# Patient Record
Sex: Female | Born: 1990 | Race: Black or African American | Hispanic: No | Marital: Single | Smoking: Current every day smoker
Health system: Southern US, Community
[De-identification: ages and names within clinical notes are randomized; demographics above are authoritative.]

## PROBLEM LIST (undated history)

## (undated) DIAGNOSIS — F32A Depression, unspecified: Secondary | ICD-10-CM

## (undated) DIAGNOSIS — F79 Unspecified intellectual disabilities: Secondary | ICD-10-CM

## (undated) DIAGNOSIS — F319 Bipolar disorder, unspecified: Secondary | ICD-10-CM

## (undated) DIAGNOSIS — F909 Attention-deficit hyperactivity disorder, unspecified type: Secondary | ICD-10-CM

## (undated) DIAGNOSIS — F259 Schizoaffective disorder, unspecified: Secondary | ICD-10-CM

## (undated) DIAGNOSIS — E119 Type 2 diabetes mellitus without complications: Secondary | ICD-10-CM

## (undated) HISTORY — DX: Bipolar disorder, unspecified: F31.9

## (undated) HISTORY — DX: Schizoaffective disorder, unspecified: F25.9

## (undated) HISTORY — DX: Attention-deficit hyperactivity disorder, unspecified type: F90.9

## (undated) HISTORY — DX: Type 2 diabetes mellitus without complications: E11.9

---
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
## 2013-07-08 DIAGNOSIS — Z8619 Personal history of other infectious and parasitic diseases: Secondary | ICD-10-CM | POA: Insufficient documentation

## 2013-07-08 DIAGNOSIS — E669 Obesity, unspecified: Secondary | ICD-10-CM | POA: Insufficient documentation

## 2013-07-08 DIAGNOSIS — F17200 Nicotine dependence, unspecified, uncomplicated: Secondary | ICD-10-CM | POA: Insufficient documentation

## 2013-07-08 DIAGNOSIS — N912 Amenorrhea, unspecified: Secondary | ICD-10-CM | POA: Insufficient documentation

## 2013-07-08 DIAGNOSIS — E1165 Type 2 diabetes mellitus with hyperglycemia: Secondary | ICD-10-CM | POA: Insufficient documentation

## 2013-07-08 DIAGNOSIS — E119 Type 2 diabetes mellitus without complications: Secondary | ICD-10-CM | POA: Insufficient documentation

## 2013-07-18 ENCOUNTER — Ambulatory Visit: Payer: Self-pay | Admitting: Emergency Medicine

## 2013-09-19 DIAGNOSIS — J4520 Mild intermittent asthma, uncomplicated: Secondary | ICD-10-CM | POA: Insufficient documentation

## 2016-02-15 ENCOUNTER — Encounter: Payer: Self-pay | Admitting: Psychiatry

## 2016-02-15 ENCOUNTER — Ambulatory Visit: Payer: Self-pay | Admitting: Psychiatry

## 2016-02-15 DIAGNOSIS — F319 Bipolar disorder, unspecified: Secondary | ICD-10-CM | POA: Insufficient documentation

## 2016-02-15 DIAGNOSIS — F209 Schizophrenia, unspecified: Secondary | ICD-10-CM | POA: Insufficient documentation

## 2016-02-15 NOTE — Progress Notes (Deleted)
Psychiatric Initial Adult Assessment   Patient Identification: Alexandria Reynolds MRN:  [id number] Date of Evaluation:  02/15/2016 Referral Source:  Chief Complaint:   Chief Complaint    Establish Care     Visit Diagnosis: No diagnosis found. Diagnosis:  There are no active problems to display for this patient.  History of Present Illness:        Associated Signs/Symptoms: Depression Symptoms:  {DEPRESSION SYMPTOMS:20000} (Hypo) Manic Symptoms:  {BHH MANIC SYMPTOMS:22872} Anxiety Symptoms:  {BHH ANXIETY SYMPTOMS:22873} Psychotic Symptoms:  {BHH PSYCHOTIC SYMPTOMS:22874} PTSD Symptoms: {BHH PTSD SYMPTOMS:22875}  Past Medical History: No past medical history on file. No past surgical history on file. Family History: No family history on file. Social History:   Social History   Social History  . Marital Status: Single    Spouse Name: N/A  . Number of Children: N/A  . Years of Education: N/A   Social History Main Topics  . Smoking status: Current Every Day Smoker -- 0.05 packs/day    Types: Cigarettes    Start date: 02/14/2014  . Smokeless tobacco: Never Used  . Alcohol Use: None  . Drug Use: None  . Sexual Activity: Not Asked   Other Topics Concern  . None   Social History Narrative  . None   Additional Social History: ***  Musculoskeletal: Strength & Muscle Tone: {desc; muscle tone:32375} Gait & Station: {PE GAIT ED NATL:22525} Patient leans: {Patient Leans:21022755}  Psychiatric Specialty Exam: HPI  ROS  Blood pressure 142/80, pulse 97, temperature 98.5 F (36.9 C), temperature source Tympanic, height 5\' 5"  (1.651 m), weight 246 lb 9.6 oz (111.857 kg), last menstrual period 02/15/2015, SpO2 97 %.Body mass index is 41.04 kg/(m^2).  General Appearance: {Appearance:22683}  Eye Contact:  {BHH EYE CONTACT:22684}  Speech:  {Speech:22685}  Volume:  {Volume (PAA):22686}  Mood:  {BHH MOOD:22306}  Affect:  {Affect (PAA):22687}  Thought Process:  {Thought  Process (PAA):22688}  Orientation:  {BHH ORIENTATION (PAA):22689}  Thought Content:  {Thought Content:22690}  Suicidal Thoughts:  {ST/HT (PAA):22692}  Homicidal Thoughts:  {ST/HT (PAA):22692}  Memory:  {BHH MEMORY:22881}  Judgement:  {Judgement (PAA):22694}  Insight:  {Insight (PAA):22695}  Psychomotor Activity:  {Psychomotor (PAA):22696}  Concentration:  {BHH GOOD/FAIR/POOR:22877}  Recall:  {BHH GOOD/FAIR/POOR:22877}  Fund of Knowledge:{BHH GOOD/FAIR/POOR:22877}  Language: {BHH GOOD/FAIR/POOR:22877}  Akathisia:  {BHH YES OR NO:22294}  Handed:  {Handed:22697}  AIMS (if indicated):  ***  Assets:  {Assets (PAA):22698}  ADL's:  {BHH WNU'U:72536}ADL'S:22290}  Cognition: {chl bhh cognition:304700322}  Sleep:  ***   Is the patient at risk to self?  {yes no:314532} Has the patient been a risk to self in the past 6 months?  {yes no:314532} Has the patient been a risk to self within the distant past?  {yes no:314532} Is the patient a risk to others?  {yes no:314532} Has the patient been a risk to others in the past 6 months?  {yes no:314532} Has the patient been a risk to others within the distant past?  {yes no:314532}  Allergies:  Allergies not on file Current Medications: No current outpatient prescriptions on file.   No current facility-administered medications for this visit.    Previous Psychotropic Medications: {YES/NO:21197}  Substance Abuse History in the last 12 months:  {yes no:314532}  Consequences of Substance Abuse: {BHH CONSEQUENCES OF SUBSTANCE ABUSE:22880}  Medical Decision Making:  {bh medical decision making:21022756}  Treatment Plan Summary: {CHL AMB BH MD TX Plan:[phone]}    Alexandria Reynolds 3/21/20171:20 PM

## 2016-02-29 ENCOUNTER — Ambulatory Visit: Payer: Self-pay | Admitting: Licensed Clinical Social Worker

## 2016-03-28 ENCOUNTER — Ambulatory Visit (INDEPENDENT_AMBULATORY_CARE_PROVIDER_SITE_OTHER): Payer: MEDICAID | Admitting: Licensed Clinical Social Worker

## 2016-03-28 ENCOUNTER — Encounter: Payer: Self-pay | Admitting: Licensed Clinical Social Worker

## 2016-03-28 DIAGNOSIS — F209 Schizophrenia, unspecified: Secondary | ICD-10-CM

## 2016-03-28 DIAGNOSIS — F319 Bipolar disorder, unspecified: Secondary | ICD-10-CM
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
## 2016-03-29 ENCOUNTER — Telehealth: Payer: Self-pay | Admitting: Licensed Clinical Social Worker

## 2016-03-29 NOTE — Telephone Encounter (Signed)
Therapist left message for Alexandria Reynolds to contact her.

## 2016-03-29 NOTE — Telephone Encounter (Signed)
Therapist discussed with guardian, Alexandria Reynolds, recommendation that patient would benefit from a comprehensive team approach to better serve her needs rather than individual therapy. Also, related that guardian would need to come in to agency to sign releases to further discuss.

## 2016-03-29 NOTE — Telephone Encounter (Signed)
Therapist left message for guardian that patient contacted this agency for services and was assessed for services.

## 2016-06-16 ENCOUNTER — Emergency Department
Admission: EM | Admit: 2016-06-16 | Discharge: 2016-06-16 | Disposition: A | Discharge status: Home / Self Care | Payer: Medicare Other | Attending: Emergency Medicine | Admitting: Emergency Medicine

## 2016-06-16 DIAGNOSIS — Z79899 Other long term (current) drug therapy: Secondary | ICD-10-CM | POA: Diagnosis not present

## 2016-06-16 DIAGNOSIS — Y999 Unspecified external cause status: Secondary | ICD-10-CM | POA: Diagnosis not present

## 2016-06-16 DIAGNOSIS — Y9301 Activity, walking, marching and hiking: Secondary | ICD-10-CM | POA: Diagnosis not present

## 2016-06-16 DIAGNOSIS — Z794 Long term (current) use of insulin: Secondary | ICD-10-CM | POA: Insufficient documentation

## 2016-06-16 DIAGNOSIS — Y929 Unspecified place or not applicable: Secondary | ICD-10-CM | POA: Diagnosis not present

## 2016-06-16 DIAGNOSIS — W010XXA Fall on same level from slipping, tripping and stumbling without subsequent striking against object, initial encounter: Secondary | ICD-10-CM | POA: Diagnosis not present

## 2016-06-16 DIAGNOSIS — J4520 Mild intermittent asthma, uncomplicated: Secondary | ICD-10-CM | POA: Insufficient documentation

## 2016-06-16 DIAGNOSIS — M25561 Pain in right knee: Secondary | ICD-10-CM | POA: Diagnosis present

## 2016-06-16 DIAGNOSIS — S8391XA Sprain of unspecified site of right knee, initial encounter: Secondary | ICD-10-CM | POA: Diagnosis not present

## 2016-06-16 DIAGNOSIS — E119 Type 2 diabetes mellitus without complications: Secondary | ICD-10-CM | POA: Diagnosis not present

## 2016-06-16 DIAGNOSIS — F17210 Nicotine dependence, cigarettes, uncomplicated: Secondary | ICD-10-CM | POA: Insufficient documentation

## 2016-06-16 DIAGNOSIS — Z7984 Long term (current) use of oral hypoglycemic drugs: Secondary | ICD-10-CM | POA: Diagnosis not present

## 2016-06-16 MED ORDER — MELOXICAM 15 MG PO TABS: 15.0000 mg | ORAL_TABLET | Freq: Every day | ORAL | Status: DC
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
## 2016-06-16 NOTE — ED Notes (Signed)
Patient reports slipped on a wet floor and fell.  Patient complains of pain to right knee. Patient ambulatory without difficulty or obvious distress noted.

## 2016-06-16 NOTE — Discharge Instructions (Signed)
How to Use a Knee Brace A knee brace is a device that you wear to support your knee, especially if the knee is healing after an injury or surgery. There are several types of knee braces. Some are designed to prevent an injury (prophylactic brace). These are often worn during sports. Others support an injured knee (functional brace) or keep it still while it heals (rehabilitative brace). People with severe arthritis of the knee may benefit from a brace that takes some pressure off the knee (unloader brace). Most knee braces are made from a combination of cloth and metal or plastic.  You may need to wear a knee brace to:  Relieve knee pain.  Help your knee support your weight (improve stability).  Help you walk farther (improve mobility).  Prevent injury.  Support your knee while it heals from surgery or from an injury. RISKS AND COMPLICATIONS Generally, knee braces are very safe to wear. However, problems may occur, including:  Skin irritation that may lead to infection.  Making your condition worse if you wear the brace in the wrong way. HOW TO USE A KNEE BRACE Different braces will have different instructions for use. Your health care provider will tell you or show you:  How to put on your brace.  How to adjust the brace.  When and how often to wear the brace.  How to remove the brace.  If you will need any assistive devices in addition to the brace, such as crutches or a cane. In general, your brace should:  Have the hinge of the brace line up with the bend of your knee.  Have straps, hooks, or tapes that fasten snugly around your leg.  Not feel too tight or too loose. HOW TO CARE FOR A KNEE BRACE  Check your brace often for signs of damage, such as loose connections or attachments. Your knee brace may get damaged or wear out during normal use.  Wash the fabric parts of your brace with soap and water.  Read the insert that comes with your brace for other specific care  instructions. SEEK MEDICAL CARE IF:  Your knee brace is too loose or too tight and you cannot adjust it.  Your knee brace causes skin redness, swelling, bruising, or irritation.  Your knee brace is not helping.  Your knee brace is making your knee pain worse.   This information is not intended to replace advice given to you by your health care provider. Make sure you discuss any questions you have with your health care provider.   Document Released: 02/03/2004 Document Revised: 08/04/2015 Document Reviewed: 03/08/2015 Elsevier Interactive Patient Education 2016 Elsevier Inc.  Knee Sprain A knee sprain is a tear in one of the strong, fibrous tissues that connect the bones (ligaments) in your knee. The severity of the sprain depends on how much of the ligament is torn. The tear can be either partial or complete. CAUSES  Often, sprains are a result of a fall or injury. The force of the impact causes the fibers of your ligament to stretch too much. This excess tension causes the fibers of your ligament to tear. SIGNS AND SYMPTOMS  You may have some loss of motion in your knee. Other symptoms include:  Bruising.  Pain in the knee area.  Tenderness of the knee to the touch.  Swelling. DIAGNOSIS  To diagnose a knee sprain, your health care provider will physically examine your knee. Your health care provider may also suggest an X-ray exam  of your knee to make sure no bones are broken. TREATMENT  If your ligament is only partially torn, treatment usually involves keeping the knee in a fixed position (immobilization) or bracing your knee for activities that require movement for several weeks. To do this, your health care provider will apply a bandage, cast, or splint to keep your knee from moving and to support your knee during movement until it heals. For a partially torn ligament, the healing process usually takes 4-6 weeks. If your ligament is completely torn, depending on which  ligament it is, you may need surgery to reconnect the ligament to the bone or reconstruct it. After surgery, a cast or splint may be applied and will need to stay on your knee for 4-6 weeks while your ligament heals. HOME CARE INSTRUCTIONS  Keep your injured knee elevated to decrease swelling.  To ease pain and swelling, apply ice to the injured area:  Put ice in a plastic bag.  Place a towel between your skin and the bag.  Leave the ice on for 20 minutes, 2-3 times a day.  Only take medicine for pain as directed by your health care provider.  Do not leave your knee unprotected until pain and stiffness go away (usually 4-6 weeks).  If you have a cast or splint, do not allow it to get wet. If you have been instructed not to remove it, cover it with a plastic bag when you shower or bathe. Do not swim.  Your health care provider may suggest exercises for you to do during your recovery to prevent or limit permanent weakness and stiffness. SEEK IMMEDIATE MEDICAL CARE IF:  Your cast or splint becomes damaged.  Your pain becomes worse.  You have significant pain, swelling, or numbness below the cast or splint. MAKE SURE YOU:  Understand these instructions.  Will watch your condition.  Will get help right away if you are not doing well or get worse.   This information is not intended to replace advice given to you by your health care provider. Make sure you discuss any questions you have with your health care provider.   Document Released: 11/13/2005 Document Revised: 12/04/2014 Document Reviewed: 06/25/2013 Elsevier Interactive Patient Education [employer]2016 Elsevier Inc.

## 2016-06-16 NOTE — ED Notes (Signed)
Pt discharged to home.  Family member driving.  Discharge instructions reviewed.  Verbalized understanding.  No questions or concerns at this time.  Teach back verified.  Pt in NAD.  No items left in ED.   

## 2016-08-01 ENCOUNTER — Encounter: Payer: Self-pay | Admitting: Emergency Medicine

## 2016-08-01 ENCOUNTER — Emergency Department
Admission: EM | Admit: 2016-08-01 | Discharge: 2016-08-01 | Disposition: A | Discharge status: Home / Self Care | Payer: 59 | Attending: Emergency Medicine | Admitting: Emergency Medicine

## 2016-08-01 DIAGNOSIS — Z79899 Other long term (current) drug therapy: Secondary | ICD-10-CM | POA: Insufficient documentation

## 2016-08-01 DIAGNOSIS — F909 Attention-deficit hyperactivity disorder, unspecified type: Secondary | ICD-10-CM | POA: Diagnosis not present

## 2016-08-01 DIAGNOSIS — E119 Type 2 diabetes mellitus without complications: Secondary | ICD-10-CM | POA: Insufficient documentation

## 2016-08-01 DIAGNOSIS — J4520 Mild intermittent asthma, uncomplicated: Secondary | ICD-10-CM | POA: Insufficient documentation

## 2016-08-01 DIAGNOSIS — F319 Bipolar disorder, unspecified: Secondary | ICD-10-CM | POA: Diagnosis present

## 2016-08-01 DIAGNOSIS — F17210 Nicotine dependence, cigarettes, uncomplicated: Secondary | ICD-10-CM | POA: Diagnosis not present

## 2016-08-01 DIAGNOSIS — F203 Undifferentiated schizophrenia: Secondary | ICD-10-CM

## 2016-08-01 DIAGNOSIS — Z5181 Encounter for therapeutic drug level monitoring: Secondary | ICD-10-CM | POA: Diagnosis not present

## 2016-08-01 DIAGNOSIS — F209 Schizophrenia, unspecified: Secondary | ICD-10-CM | POA: Diagnosis not present

## 2016-08-01 DIAGNOSIS — Z794 Long term (current) use of insulin: Secondary | ICD-10-CM | POA: Diagnosis not present

## 2016-08-01 DIAGNOSIS — R4689 Other symptoms and signs involving appearance and behavior: Secondary | ICD-10-CM

## 2016-08-01 DIAGNOSIS — E1165 Type 2 diabetes mellitus with hyperglycemia: Secondary | ICD-10-CM

## 2016-08-01 DIAGNOSIS — F89 Unspecified disorder of psychological development: Secondary | ICD-10-CM

## 2016-08-01 DIAGNOSIS — Z7984 Long term (current) use of oral hypoglycemic drugs: Secondary | ICD-10-CM | POA: Diagnosis not present

## 2016-08-01 DIAGNOSIS — F918 Other conduct disorders: Secondary | ICD-10-CM | POA: Diagnosis present

## 2016-08-01 LAB — CBC
HCT: 41.8 % (ref 35.0–47.0)
Hemoglobin: 14.1 g/dL (ref 12.0–16.0)
MCH: 28.2 pg (ref 26.0–34.0)
MCHC: 33.8 g/dL (ref 32.0–36.0)
MCV: 83.5 fL (ref 80.0–100.0)
Platelets: 244 10*3/uL (ref 150–440)
RBC: 5.01 MIL/uL (ref 3.80–5.20)
RDW: 13.3 % (ref 11.5–14.5)
WBC: 6.4 10*3/uL (ref 3.6–11.0)

## 2016-08-01 LAB — URINE DRUG SCREEN, QUALITATIVE (ARMC ONLY)
Amphetamines, Ur Screen: NOT DETECTED
Barbiturates, Ur Screen: NOT DETECTED
Benzodiazepine, Ur Scrn: NOT DETECTED
Cannabinoid 50 Ng, Ur ~~LOC~~: NOT DETECTED
Cocaine Metabolite,Ur ~~LOC~~: NOT DETECTED
MDMA (Ecstasy)Ur Screen: NOT DETECTED
Methadone Scn, Ur: NOT DETECTED
Opiate, Ur Screen: NOT DETECTED
Phencyclidine (PCP) Ur S: NOT DETECTED
Tricyclic, Ur Screen: POSITIVE — AB

## 2016-08-01 LAB — COMPREHENSIVE METABOLIC PANEL
ALT: 41 U/L (ref 14–54)
AST: 44 U/L — ABNORMAL HIGH (ref 15–41)
Albumin: 4.6 g/dL (ref 3.5–5.0)
Alkaline Phosphatase: 58 U/L (ref 38–126)
Anion gap: 7 (ref 5–15)
BUN: 11 mg/dL (ref 6–20)
CO2: 24 mmol/L (ref 22–32)
Calcium: 9.5 mg/dL (ref 8.9–10.3)
Chloride: 106 mmol/L (ref 101–111)
Creatinine, Ser: 0.68 mg/dL (ref 0.44–1.00)
GFR calc Af Amer: >60 mL/min (ref >60)
GFR calc non Af Amer: >60 mL/min (ref >60)
Glucose, Bld: 90 mg/dL (ref 65–99)
Potassium: 3.7 mmol/L (ref 3.5–5.1)
Sodium: 137 mmol/L (ref 135–145)
Total Bilirubin: 0.4 mg/dL (ref 0.3–1.2)
Total Protein: 8.7 g/dL — ABNORMAL HIGH (ref 6.5–8.1)

## 2016-08-01 LAB — SALICYLATE LEVEL: Salicylate Lvl: <4 mg/dL (ref 2.8–30.0)

## 2016-08-01 LAB — ETHANOL: Alcohol, Ethyl (B): <5 mg/dL (ref <5)

## 2016-08-01 LAB — POCT PREGNANCY, URINE: Preg Test, Ur: NEGATIVE

## 2016-08-01 LAB — GLUCOSE, CAPILLARY: Glucose-Capillary: 109 mg/dL — ABNORMAL HIGH (ref 65–99)

## 2016-08-01 LAB — ACETAMINOPHEN LEVEL: Acetaminophen (Tylenol), Serum: <10 ug/mL — ABNORMAL LOW (ref 10–30)

## 2016-08-01 MED ORDER — INSULIN ASPART 100 UNIT/ML FLEXPEN: 12.0000 [IU] | PEN_INJECTOR | Freq: Three times a day (TID) | SUBCUTANEOUS | Status: DC

## 2016-08-01 MED ORDER — DARIFENACIN HYDROBROMIDE ER 15 MG PO TB24
15.0000 mg | ORAL_TABLET | Freq: Every day | ORAL | Status: DC
  Filled 2016-08-01 (×2): qty 1

## 2016-08-01 MED ORDER — LORATADINE 10 MG PO TABS: 10.0000 mg | ORAL_TABLET | Freq: Every day | ORAL | Status: DC

## 2016-08-01 MED ORDER — INSULIN DETEMIR 100 UNIT/ML ~~LOC~~ SOLN: 23.0000 [IU] | Freq: Every day | SUBCUTANEOUS | Status: DC

## 2016-08-01 MED ORDER — METOCLOPRAMIDE HCL 10 MG PO TABS: 10.0000 mg | ORAL_TABLET | Freq: Two times a day (BID) | ORAL | Status: DC

## 2016-08-01 MED ORDER — MELOXICAM 7.5 MG PO TABS: 15.0000 mg | ORAL_TABLET | Freq: Every day | ORAL | Status: DC

## 2016-08-01 MED ORDER — TRAZODONE HCL 100 MG PO TABS: 100.0000 mg | ORAL_TABLET | Freq: Every evening | ORAL | Status: DC | PRN

## 2016-08-01 MED ORDER — BACITRACIN ZINC 500 UNIT/GM EX OINT
TOPICAL_OINTMENT | CUTANEOUS | Status: AC
  Filled 2016-08-01: qty 0.9

## 2016-08-01 MED ORDER — LAMOTRIGINE 25 MG PO TABS: 200.0000 mg | ORAL_TABLET | Freq: Every day | ORAL | Status: DC

## 2016-08-01 MED ORDER — CHLORPROMAZINE HCL 100 MG PO TABS
100.0000 mg | ORAL_TABLET | Freq: Every day | ORAL | Status: DC
  Filled 2016-08-01: qty 1

## 2016-08-01 MED ORDER — METFORMIN HCL 500 MG PO TABS: 500.0000 mg | ORAL_TABLET | Freq: Two times a day (BID) | ORAL | Status: DC
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
## 2016-08-01 NOTE — ED Provider Notes (Signed)
Discussed with Dr. Toni Amendlapacs after his evaluation. Feels the patient is psychiatrically stable and not requiring any acute treatment or inpatient management. The shank and be discharged back to her group home. IVC rescinded. Patient is medically stable, will discharge to outpatient follow-up.   Sharman CheekPhillip Lamanda Rudder, MD 08/01/16 [phone]

## 2016-08-01 NOTE — ED Notes (Signed)
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
## 2016-08-01 NOTE — ED Notes (Signed)
Patient presents to the ED under IVC from group home.  Per Officer patient has been calm and appropriate since noon.  Patient states, "I feel like I've been more angry lately, I think I need more of my medicines."  Patient reports feeling unsafe generally and states, "I don't really want to talk about it."  Patient denies SI and HI.  Denies auditory and visual hallucinations.  Denies drug use.

## 2016-08-01 NOTE — ED Notes (Signed)
Pt is awaiting for transportation back to her group home.

## 2016-08-01 NOTE — ED Notes (Signed)
BEHAVIORAL HEALTH ROUNDING Patient sleeping: No. Patient alert and oriented: yes Behavior appropriate: Yes.  ; If no, describe:  Nutrition and fluids offered: Yes  Toileting and hygiene offered: Yes  Sitter present: q 15 min checks Law enforcement present: Yes  
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
## 2017-08-04 ENCOUNTER — Emergency Department
Admission: EM | Admit: 2017-08-04 | Discharge: 2017-08-04 | Disposition: A | Discharge status: Home / Self Care | Payer: Medicare Other | Attending: Emergency Medicine | Admitting: Emergency Medicine

## 2017-08-04 DIAGNOSIS — F319 Bipolar disorder, unspecified: Secondary | ICD-10-CM | POA: Insufficient documentation

## 2017-08-04 DIAGNOSIS — Z794 Long term (current) use of insulin: Secondary | ICD-10-CM | POA: Diagnosis not present

## 2017-08-04 DIAGNOSIS — F17210 Nicotine dependence, cigarettes, uncomplicated: Secondary | ICD-10-CM | POA: Insufficient documentation

## 2017-08-04 DIAGNOSIS — J45909 Unspecified asthma, uncomplicated: Secondary | ICD-10-CM | POA: Insufficient documentation

## 2017-08-04 DIAGNOSIS — E119 Type 2 diabetes mellitus without complications: Secondary | ICD-10-CM | POA: Insufficient documentation

## 2017-08-04 DIAGNOSIS — F4320 Adjustment disorder, unspecified: Secondary | ICD-10-CM | POA: Diagnosis not present

## 2017-08-04 DIAGNOSIS — F329 Major depressive disorder, single episode, unspecified: Secondary | ICD-10-CM | POA: Diagnosis present

## 2017-08-04 DIAGNOSIS — Z79899 Other long term (current) drug therapy: Secondary | ICD-10-CM | POA: Insufficient documentation

## 2017-08-04 LAB — COMPREHENSIVE METABOLIC PANEL
ALT: 38 U/L (ref 14–54)
AST: 44 U/L — ABNORMAL HIGH (ref 15–41)
Albumin: 4.1 g/dL (ref 3.5–5.0)
Alkaline Phosphatase: 52 U/L (ref 38–126)
Anion gap: 6 (ref 5–15)
BUN: 9 mg/dL (ref 6–20)
CO2: 25 mmol/L (ref 22–32)
Calcium: 9.3 mg/dL (ref 8.9–10.3)
Chloride: 107 mmol/L (ref 101–111)
Creatinine, Ser: 0.68 mg/dL (ref 0.44–1.00)
GFR calc Af Amer: >60 mL/min (ref >60)
GFR calc non Af Amer: >60 mL/min (ref >60)
Glucose, Bld: 145 mg/dL — ABNORMAL HIGH (ref 65–99)
Potassium: 3.4 mmol/L — ABNORMAL LOW (ref 3.5–5.1)
Sodium: 138 mmol/L (ref 135–145)
Total Bilirubin: 0.3 mg/dL (ref 0.3–1.2)
Total Protein: 8.4 g/dL — ABNORMAL HIGH (ref 6.5–8.1)

## 2017-08-04 LAB — URINE DRUG SCREEN, QUALITATIVE (ARMC ONLY)
Amphetamines, Ur Screen: NOT DETECTED
Barbiturates, Ur Screen: NOT DETECTED
Benzodiazepine, Ur Scrn: NOT DETECTED
Cannabinoid 50 Ng, Ur ~~LOC~~: NOT DETECTED
Cocaine Metabolite,Ur ~~LOC~~: NOT DETECTED
MDMA (Ecstasy)Ur Screen: NOT DETECTED
Methadone Scn, Ur: NOT DETECTED
Opiate, Ur Screen: NOT DETECTED
Phencyclidine (PCP) Ur S: NOT DETECTED
Tricyclic, Ur Screen: POSITIVE — AB

## 2017-08-04 LAB — CBC
HCT: 40.1 % (ref 35.0–47.0)
Hemoglobin: 13.5 g/dL (ref 12.0–16.0)
MCH: 28.4 pg (ref 26.0–34.0)
MCHC: 33.7 g/dL (ref 32.0–36.0)
MCV: 84.1 fL (ref 80.0–100.0)
Platelets: 245 10*3/uL (ref 150–440)
RBC: 4.77 MIL/uL (ref 3.80–5.20)
RDW: 13.3 % (ref 11.5–14.5)
WBC: 5.4 10*3/uL (ref 3.6–11.0)

## 2017-08-04 LAB — POCT PREGNANCY, URINE: Preg Test, Ur: NEGATIVE

## 2017-08-04 LAB — ETHANOL: Alcohol, Ethyl (B): <5 mg/dL (ref <5)

## 2017-08-04 NOTE — ED Notes (Signed)
BEHAVIORAL HEALTH ROUNDING Patient sleeping: No. Patient alert and oriented: yes Behavior appropriate: Yes.  ; If no, describe:  Nutrition and fluids offered: Yes  Toileting and hygiene offered: Yes  Sitter present: not applicable Law enforcement present: Yes  

## 2017-08-04 NOTE — ED Notes (Signed)
Pt has one backpack. No cell phone. 3 dollars in front part of backpack. Clothes placed in bag with backpack.

## 2017-08-04 NOTE — ED Notes (Signed)
BEHAVIORAL HEALTH ROUNDING Patient sleeping: Yes.   Patient alert and oriented: not applicable Behavior appropriate: Yes.  ; If no, describe:  Nutrition and fluids offered: Yes  Toileting and hygiene offered: Yes  Sitter present: not applicable Law enforcement present: Yes  

## 2017-08-04 NOTE — ED Provider Notes (Signed)
[facility] Emergency Department Provider Note  ____________________________________________   First MD Initiated Contact with Patient 08/04/17 1425     (approximate)  I have reviewed the triage vital signs and the nursing notes.   HISTORY  Chief Complaint Psychiatric Evaluation    HPI Alexandria Reynolds is a 26 y.o. female with psychiatric history as listed below who lives in a group home who presents from the group home for evaluation.  She states that he became very upset today because the other people in a group home were talking about their families and she has no family.  As result she became depressed but also became angry and could not control her temper.  She believes this is because she needs an adjustment to her medications.  She states she has been taking her medications as prescribed but could not control her temper.  She describes onset of her symptoms as acute and they were severe but she is calm and appropriate at this time.  She denies any recent illnesses.  The patient denies fever/chills, chest pain, shortness of breath, nausea, vomiting, abdominal pain, dysuria.she denies suicidal ideation and homicidal ideation.   Past Medical History:  Diagnosis Date  . ADHD (attention deficit hyperactivity disorder)   . Bipolar disorder (HCC)   . Diabetes mellitus, type II (HCC)   . Schizoaffective disorder [facility])     Patient Active Problem List   Diagnosis Date Noted  . Developmental disability 08/01/2016  . Bipolar I disorder (HCC) 03/28/2016  . Bipolar affective disorder (H[facility]) 02/15/2016  . Schizophrenia (H[facility]) 02/15/2016  . Asthma, mild intermittent 09/19/2013  . Absence of menstruation 07/08/2013  . Type 2 diabetes mellitus (H[facility]) 07/08/2013  . H/O infectious disease 07/08/2013  . Adiposity 07/08/2013  . Current smoker 07/08/2013    History reviewed. No pertinent surgical history.  Prior to Admission medications   Medication Sig Start  Date End Date Taking? Authorizing Provider  chlorproMAZINE (THORAZINE) 50 MG tablet Take 100 mg by mouth at bedtime. Given at 8am and 3pm    [provider]  insulin aspart (NOVOLOG) 100 UNIT/ML FlexPen Inject 12 Units into the skin 3 (three) times daily with meals.  07/02/13   [provider]  insulin detemir (LEVEMIR) 100 UNIT/ML injection Inject 23 Units into the skin at bedtime. Flex pen    [provider]  lamoTRIgine (LAMICTAL) 150 MG tablet Take 200 mg by mouth daily.     [provider]  loratadine (CLARITIN) 10 MG tablet  06/29/14   [provider]  meloxicam (MOBIC) 15 MG tablet Take 1 tablet (15 mg total) by mouth daily. 06/16/16   Cuthriell, Delorise Royals, PA-C  metFORMIN (GLUCOPHAGE) 500 MG tablet Take 500 mg by mouth 2 (two) times daily.    [provider]  metoCLOPramide (REGLAN) 10 MG tablet Take by mouth 2 (two) times daily.     [provider]  solifenacin (VESICARE) 10 MG tablet Take 10 mg by mouth daily.    [provider]  traZODone (DESYREL) 100 MG tablet Take by mouth.    [provider]    Allergies Amoxicillin and Penicillins  Family History  Problem Relation Age of Onset  . Drug abuse Mother   . Alcohol abuse Mother   . Alcohol abuse Brother   . Drug abuse Brother   . Alcohol abuse Brother     Social History Social History  Substance Use Topics  . Smoking status: Current Every Day Smoker  Packs/day: 0.05    Types: Cigarettes    Start date: 02/14/2014  . Smokeless tobacco: Never Used  . Alcohol use No    Review of Systems Constitutional: No fever/chills Eyes: No visual changes. ENT: No sore throat. Cardiovascular: Denies chest pain. Respiratory: Denies shortness of breath. Gastrointestinal: No abdominal pain.  No nausea, no vomiting.  No diarrhea.  No constipation. Genitourinary: Negative for dysuria. Musculoskeletal: Negative for neck pain.  Negative for back  pain. Integumentary: Negative for rash. Neurological: Negative for headaches, focal weakness or numbness. Psychiatric:some depression and anger management issues, but no suicidal ideation  ____________________________________________   PHYSICAL EXAM:  VITAL SIGNS: ED Triage Vitals  Enc Vitals Group     BP 08/04/17 1217 134/87     Pulse Rate 08/04/17 1217 (!) 106     Resp 08/04/17 1217 18     Temp 08/04/17 1217 98.6 F (37 C)     Temp Source 08/04/17 1217 Oral     SpO2 08/04/17 1217 96 %     Weight 08/04/17 1218 104.3 kg (230 lb)     Height 08/04/17 1218 1.702 m (5\' 7" )     Head Circumference --      Peak Flow --      Pain Score --      Pain Loc --      Pain Edu? --      Excl. in GC? --     Constitutional: Alert and oriented. Well appearing and in no acute distress. Eyes: Conjunctivae are normal.  Head: Atraumatic. Cardiovascular: Normal rate, regular rhythm. Good peripheral circulation.  Respiratory: Normal respiratory effort.  No retractions.  Musculoskeletal: No lower extremity tenderness nor edema. No gross deformities of extremities. Neurologic:  Normal speech and language. No gross focal neurologic deficits are appreciated.  Skin:  Skin is warm, dry and intact. No rash noted. Psychiatric: Mood and affect are normal. Speech and behavior are normal. calm and cooperative.  Denies SI/HI  ____________________________________________   LABS (all labs ordered are listed, but only abnormal results are displayed)  Labs Reviewed  COMPREHENSIVE METABOLIC PANEL - Abnormal; Notable for the following:       Result Value   Potassium 3.4 (*)    Glucose, Bld 145 (*)    Total Protein 8.4 (*)    AST 44 (*)    All other components within normal limits  URINE DRUG SCREEN, QUALITATIVE ([facility] ONLY) - Abnormal; Notable for the following:    Tricyclic, Ur Screen POSITIVE (*)    All other components within normal limits  ETHANOL  CBC  POCT PREGNANCY, URINE    ____________________________________________  EKG  None - EKG not ordered by ED physician ____________________________________________  RADIOLOGY   No results found.  ____________________________________________   PROCEDURES  Critical Care performed: No   Procedure(s) performed:   Procedures   ____________________________________________   INITIAL IMPRESSION / ASSESSMENT AND PLAN / ED COURSE  Pertinent labs & imaging results that were available during my care of the patient were reviewed by me and considered in my medical decision making (see chart for details).  patient is well-appearing and in no acute distress.  She seems to have insight into her emotional state and has no intention of hurting herself or anyone else.  She does not meet criteria for involuntary commitment.  She is requesting to speak with psychiatrist.  I explained that a psychiatrist very likely will not adjust her medications today, but she would still like to speak with one about her  problems.  I have ordered a telepsych consult and will defer to their recommendations and opinion.  No evidence of acute or emergent medical condition at this time.      ____________________________________________  FINAL CLINICAL IMPRESSION(S) / ED DIAGNOSES  Final diagnoses:  Bipolar affective disorder, remission status unspecified (HCC)     MEDICATIONS GIVEN DURING THIS VISIT:  Medications - No data to display   NEW OUTPATIENT MEDICATIONS STARTED DURING THIS VISIT:  New Prescriptions   No medications on file    Modified Medications   No medications on file    Discontinued Medications   No medications on file     Note:  This document was prepared using Dragon voice recognition software and may include unintentional dictation errors.    Loleta Rose, MD 08/04/17 [phone]

## 2017-08-04 NOTE — ED Notes (Signed)
Joycelyn ManRobin [facility] from group home contacted at [phone] and notified patient discharged. States she will be able to contact patients primary psychiatry on Monday to make follow up appointment. Patient dressing for discharge and group home will pick her up from lobby.

## 2017-08-04 NOTE — ED Notes (Signed)

## 2017-08-04 NOTE — Discharge Instructions (Signed)
Please seek medical attention and help for any thoughts about wanting to harm yourself, harm others, any concerning change in behavior, severe depression, inappropriate drug use or any other new or concerning symptoms. ° °

## 2017-08-04 NOTE — ED Provider Notes (Signed)
-----------------------------------------   5:07 PM on 08/04/2017 -----------------------------------------  Patient seen by specialist on call. I do not feel she requires an inpatient admission at this time. They did not recommend any medication changes. Will discharge.   Phineas SemenGoodman, Willim Turnage, MD 08/04/17 [phone]

## 2017-08-04 NOTE — ED Notes (Signed)
Has taken lunch, resting on stretcher with eyes closed.

## 2017-08-04 NOTE — ED Triage Notes (Signed)
Pt came to ED from group home [facility] for psych eval. History of bipolar, schizophrenia, adhd. Reports wants to talk to someone and get meds adjusted. Patient calm and cooperative.

## 2017-08-04 NOTE — ED Notes (Signed)
[facility]   [facility]

## 2017-08-06 ENCOUNTER — Emergency Department
Admission: EM | Admit: 2017-08-06 | Discharge: 2017-08-07 | Disposition: A | Discharge status: Home / Self Care | Payer: Medicare Other | Attending: Emergency Medicine | Admitting: Emergency Medicine

## 2017-08-06 ENCOUNTER — Encounter: Payer: Self-pay | Admitting: Emergency Medicine

## 2017-08-06 DIAGNOSIS — F819 Developmental disorder of scholastic skills, unspecified: Secondary | ICD-10-CM | POA: Diagnosis not present

## 2017-08-06 DIAGNOSIS — E119 Type 2 diabetes mellitus without complications: Secondary | ICD-10-CM | POA: Insufficient documentation

## 2017-08-06 DIAGNOSIS — J45909 Unspecified asthma, uncomplicated: Secondary | ICD-10-CM | POA: Diagnosis not present

## 2017-08-06 DIAGNOSIS — R4589 Other symptoms and signs involving emotional state: Secondary | ICD-10-CM | POA: Diagnosis not present

## 2017-08-06 DIAGNOSIS — Z79899 Other long term (current) drug therapy: Secondary | ICD-10-CM | POA: Insufficient documentation

## 2017-08-06 DIAGNOSIS — F319 Bipolar disorder, unspecified: Secondary | ICD-10-CM | POA: Diagnosis present

## 2017-08-06 DIAGNOSIS — F909 Attention-deficit hyperactivity disorder, unspecified type: Secondary | ICD-10-CM | POA: Diagnosis not present

## 2017-08-06 DIAGNOSIS — E1165 Type 2 diabetes mellitus with hyperglycemia: Secondary | ICD-10-CM

## 2017-08-06 DIAGNOSIS — F4325 Adjustment disorder with mixed disturbance of emotions and conduct: Secondary | ICD-10-CM | POA: Diagnosis not present

## 2017-08-06 DIAGNOSIS — F17210 Nicotine dependence, cigarettes, uncomplicated: Secondary | ICD-10-CM | POA: Insufficient documentation

## 2017-08-06 DIAGNOSIS — Z794 Long term (current) use of insulin: Secondary | ICD-10-CM | POA: Insufficient documentation

## 2017-08-06 DIAGNOSIS — R4689 Other symptoms and signs involving appearance and behavior: Secondary | ICD-10-CM

## 2017-08-06 LAB — URINE DRUG SCREEN, QUALITATIVE (ARMC ONLY)
Amphetamines, Ur Screen: NOT DETECTED
Barbiturates, Ur Screen: NOT DETECTED
Benzodiazepine, Ur Scrn: NOT DETECTED
Cannabinoid 50 Ng, Ur ~~LOC~~: NOT DETECTED
Cocaine Metabolite,Ur ~~LOC~~: NOT DETECTED
MDMA (Ecstasy)Ur Screen: NOT DETECTED
Methadone Scn, Ur: NOT DETECTED
Opiate, Ur Screen: NOT DETECTED
Phencyclidine (PCP) Ur S: NOT DETECTED
Tricyclic, Ur Screen: NOT DETECTED

## 2017-08-06 LAB — CBC
HCT: 40.2 % (ref 35.0–47.0)
Hemoglobin: 13.8 g/dL (ref 12.0–16.0)
MCH: 28.9 pg (ref 26.0–34.0)
MCHC: 34.4 g/dL (ref 32.0–36.0)
MCV: 84 fL (ref 80.0–100.0)
Platelets: 258 10*3/uL (ref 150–440)
RBC: 4.78 MIL/uL (ref 3.80–5.20)
RDW: 13.4 % (ref 11.5–14.5)
WBC: 8.1 10*3/uL (ref 3.6–11.0)

## 2017-08-06 LAB — COMPREHENSIVE METABOLIC PANEL
ALT: 31 U/L (ref 14–54)
AST: 27 U/L (ref 15–41)
Albumin: 4.5 g/dL (ref 3.5–5.0)
Alkaline Phosphatase: 56 U/L (ref 38–126)
Anion gap: 7 (ref 5–15)
BUN: 13 mg/dL (ref 6–20)
CO2: 25 mmol/L (ref 22–32)
Calcium: 9.4 mg/dL (ref 8.9–10.3)
Chloride: 105 mmol/L (ref 101–111)
Creatinine, Ser: 0.62 mg/dL (ref 0.44–1.00)
GFR calc Af Amer: >60 mL/min (ref >60)
GFR calc non Af Amer: >60 mL/min (ref >60)
Glucose, Bld: 152 mg/dL — ABNORMAL HIGH (ref 65–99)
Potassium: 3.8 mmol/L (ref 3.5–5.1)
Sodium: 137 mmol/L (ref 135–145)
Total Bilirubin: 0.3 mg/dL (ref 0.3–1.2)
Total Protein: 8.6 g/dL — ABNORMAL HIGH (ref 6.5–8.1)

## 2017-08-06 LAB — POCT PREGNANCY, URINE: Preg Test, Ur: NEGATIVE

## 2017-08-06 LAB — ACETAMINOPHEN LEVEL: Acetaminophen (Tylenol), Serum: <10 ug/mL — ABNORMAL LOW (ref 10–30)

## 2017-08-06 LAB — SALICYLATE LEVEL: Salicylate Lvl: <7 mg/dL (ref 2.8–30.0)

## 2017-08-06 LAB — ETHANOL: Alcohol, Ethyl (B): <5 mg/dL (ref <5)

## 2017-08-06 NOTE — ED Notes (Signed)
MD at bedside. 
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
## 2017-08-07 DIAGNOSIS — F4325 Adjustment disorder with mixed disturbance of emotions and conduct: Secondary | ICD-10-CM | POA: Diagnosis not present

## 2017-08-07 DIAGNOSIS — R4589 Other symptoms and signs involving emotional state: Secondary | ICD-10-CM | POA: Diagnosis not present

## 2017-08-07 LAB — GLUCOSE, CAPILLARY
Glucose-Capillary: 144 mg/dL — ABNORMAL HIGH (ref 65–99)
Glucose-Capillary: 161 mg/dL — ABNORMAL HIGH (ref 65–99)
Glucose-Capillary: 108 mg/dL — ABNORMAL HIGH (ref 65–99)
Glucose-Capillary: 109 mg/dL — ABNORMAL HIGH (ref 65–99)

## 2017-08-07 MED ORDER — LORATADINE 10 MG PO TABS
10.0000 mg | ORAL_TABLET | Freq: Every day | ORAL | Status: DC
  Administered 2017-08-07: 10 mg via ORAL
  Filled 2017-08-07: qty 1

## 2017-08-07 MED ORDER — INSULIN ASPART 100 UNIT/ML ~~LOC~~ SOLN
12.0000 [IU] | Freq: Three times a day (TID) | SUBCUTANEOUS | Status: DC
  Administered 2017-08-07 (×3): 12 [IU] via SUBCUTANEOUS
  Filled 2017-08-07 (×2): qty 1

## 2017-08-07 MED ORDER — METOCLOPRAMIDE HCL 10 MG PO TABS
10.0000 mg | ORAL_TABLET | Freq: Two times a day (BID) | ORAL | Status: DC
  Administered 2017-08-07 (×2): 10 mg via ORAL
  Filled 2017-08-07 (×4): qty 1

## 2017-08-07 MED ORDER — CHLORPROMAZINE HCL 25 MG PO TABS
100.0000 mg | ORAL_TABLET | Freq: Every day | ORAL | Status: DC
  Administered 2017-08-07: 100 mg via ORAL
  Filled 2017-08-07: qty 1

## 2017-08-07 MED ORDER — LAMOTRIGINE 100 MG PO TABS
200.0000 mg | ORAL_TABLET | Freq: Every day | ORAL | Status: DC
  Administered 2017-08-07: 200 mg via ORAL
  Filled 2017-08-07: qty 2

## 2017-08-07 MED ORDER — TRAZODONE HCL 100 MG PO TABS
100.0000 mg | ORAL_TABLET | Freq: Every day | ORAL | Status: DC
  Administered 2017-08-07: 100 mg via ORAL
  Filled 2017-08-07: qty 1

## 2017-08-07 MED ORDER — METFORMIN HCL 500 MG PO TABS
500.0000 mg | ORAL_TABLET | Freq: Two times a day (BID) | ORAL | Status: DC
  Administered 2017-08-07 (×2): 500 mg via ORAL
  Filled 2017-08-07 (×2): qty 1

## 2017-08-07 MED ORDER — INSULIN DETEMIR 100 UNIT/ML ~~LOC~~ SOLN
23.0000 [IU] | Freq: Every day | SUBCUTANEOUS | Status: DC
  Administered 2017-08-07: 23 [IU] via SUBCUTANEOUS
  Filled 2017-08-07 (×2): qty 0.23
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
## 2017-08-07 NOTE — ED Notes (Signed)
ED Provider at bedside. 
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
## 2017-08-07 NOTE — ED Notes (Signed)
Patient discharged to group home. Denies SI, HI, AVH. Pt calm and cooperative in no distress. Belongings returned. AVS given to group home representative.

## 2017-08-07 NOTE — ED Notes (Signed)
Hourly rounding reveals patient sleeping in room. No complaints, stable, in no acute distress. Q15 minute rounds and monitoring via [employer] to continue. 
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
## 2017-08-07 NOTE — ED Notes (Signed)
Patient took shower today. In no distress, pleasant and cooperative.

## 2018-04-07 ENCOUNTER — Encounter: Payer: Self-pay | Admitting: Emergency Medicine

## 2018-04-07 DIAGNOSIS — F17210 Nicotine dependence, cigarettes, uncomplicated: Secondary | ICD-10-CM | POA: Insufficient documentation

## 2018-04-07 DIAGNOSIS — Z794 Long term (current) use of insulin: Secondary | ICD-10-CM | POA: Insufficient documentation

## 2018-04-07 DIAGNOSIS — E119 Type 2 diabetes mellitus without complications: Secondary | ICD-10-CM | POA: Insufficient documentation

## 2018-04-07 DIAGNOSIS — F209 Schizophrenia, unspecified: Secondary | ICD-10-CM | POA: Insufficient documentation

## 2018-04-07 DIAGNOSIS — F3132 Bipolar disorder, current episode depressed, moderate: Secondary | ICD-10-CM | POA: Diagnosis not present

## 2018-04-07 DIAGNOSIS — Z79899 Other long term (current) drug therapy: Secondary | ICD-10-CM | POA: Diagnosis not present

## 2018-04-07 DIAGNOSIS — Z046 Encounter for general psychiatric examination, requested by authority: Secondary | ICD-10-CM | POA: Diagnosis present

## 2018-04-07 LAB — CBC
HCT: 37.3 % (ref 35.0–47.0)
Hemoglobin: 12.6 g/dL (ref 12.0–16.0)
MCH: 28.6 pg (ref 26.0–34.0)
MCHC: 33.9 g/dL (ref 32.0–36.0)
MCV: 84.5 fL (ref 80.0–100.0)
Platelets: 264 10*3/uL (ref 150–440)
RBC: 4.41 MIL/uL (ref 3.80–5.20)
RDW: 13 % (ref 11.5–14.5)
WBC: 7.8 10*3/uL (ref 3.6–11.0)

## 2018-04-07 LAB — ETHANOL: Alcohol, Ethyl (B): <10 mg/dL (ref <10)

## 2018-04-07 LAB — COMPREHENSIVE METABOLIC PANEL
ALT: 15 U/L (ref 14–54)
AST: 27 U/L (ref 15–41)
Albumin: 4.4 g/dL (ref 3.5–5.0)
Alkaline Phosphatase: 54 U/L (ref 38–126)
Anion gap: 9 (ref 5–15)
BUN: 18 mg/dL (ref 6–20)
CO2: 23 mmol/L (ref 22–32)
Calcium: 9 mg/dL (ref 8.9–10.3)
Chloride: 104 mmol/L (ref 101–111)
Creatinine, Ser: 0.67 mg/dL (ref 0.44–1.00)
GFR calc Af Amer: >60 mL/min (ref >60)
GFR calc non Af Amer: >60 mL/min (ref >60)
Glucose, Bld: 167 mg/dL — ABNORMAL HIGH (ref 65–99)
Potassium: 3.7 mmol/L (ref 3.5–5.1)
Sodium: 136 mmol/L (ref 135–145)
Total Bilirubin: 0.2 mg/dL — ABNORMAL LOW (ref 0.3–1.2)
Total Protein: 8.6 g/dL — ABNORMAL HIGH (ref 6.5–8.1)

## 2018-04-07 LAB — POCT PREGNANCY, URINE: Preg Test, Ur: NEGATIVE
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
## 2018-04-08 ENCOUNTER — Emergency Department
Admission: EM | Admit: 2018-04-08 | Discharge: 2018-04-08 | Disposition: A | Discharge status: Home / Self Care | Payer: Medicare Other | Attending: Emergency Medicine | Admitting: Emergency Medicine

## 2018-04-08 DIAGNOSIS — F209 Schizophrenia, unspecified: Secondary | ICD-10-CM

## 2018-04-08 DIAGNOSIS — F3132 Bipolar disorder, current episode depressed, moderate: Secondary | ICD-10-CM

## 2018-04-08 LAB — URINE DRUG SCREEN, QUALITATIVE (ARMC ONLY)
Amphetamines, Ur Screen: NOT DETECTED
Barbiturates, Ur Screen: NOT DETECTED
Benzodiazepine, Ur Scrn: NOT DETECTED
Cannabinoid 50 Ng, Ur ~~LOC~~: NOT DETECTED
Cocaine Metabolite,Ur ~~LOC~~: NOT DETECTED
MDMA (Ecstasy)Ur Screen: NOT DETECTED
Methadone Scn, Ur: NOT DETECTED
Opiate, Ur Screen: NOT DETECTED
Phencyclidine (PCP) Ur S: NOT DETECTED
Tricyclic, Ur Screen: NOT DETECTED

## 2018-04-08 LAB — SALICYLATE LEVEL: Salicylate Lvl: <7 mg/dL (ref 2.8–30.0)

## 2018-04-08 LAB — ACETAMINOPHEN LEVEL: Acetaminophen (Tylenol), Serum: <10 ug/mL — ABNORMAL LOW (ref 10–30)

## 2018-04-08 NOTE — ED Provider Notes (Signed)
[facility] Emergency Department Provider Note   ____________________________________________   First MD Initiated Contact with Patient 04/08/18 0011     (approximate)  I have reviewed the triage vital signs and the nursing notes.   HISTORY  Chief Complaint Mental Health Problem    HPI Alexandria Reynolds is a 27 y.o. female who presents voluntarily from the group home accompanied by [facility] police with a chief complaint of argument with her roommate.  Patient has a history of bipolar disorder, schizophrenia who argued with her roommate over her cigarettes.  Reportedly patient made a statement about cutting herself.  However, patient currently denies active SI/HI/AH/VH.  Voices no medical complaints.   Past Medical History:  Diagnosis Date  . ADHD (attention deficit hyperactivity disorder)   . Bipolar disorder (HCC)   . Diabetes mellitus, type II (HCC)   . Schizoaffective disorder [facility])     Patient Active Problem List   Diagnosis Date Noted  . 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 Developmental disability 08/01/2016  . Bipolar I disorder (HCC) 03/28/2016  . Bipolar affective disorder (HCC) 02/15/2016  . Schizophrenia (HCC) 02/15/2016  . Asthma, mild intermittent 09/19/2013  . Absence of menstruation 07/08/2013  . Type 2 diabetes mellitus (HCC) 07/08/2013  . H/O infectious disease 07/08/2013  . Adiposity 07/08/2013  . Current smoker 07/08/2013    History reviewed. No pertinent surgical history.  Prior to Admission medications   Medication Sig Start Date End Date Taking? Authorizing Provider  chlorproMAZINE (THORAZINE) 50 MG tablet Take 100 mg by mouth at bedtime. Given at 8am and 3pm    [provider]  insulin aspart (NOVOLOG) 100 UNIT/ML FlexPen Inject 12 Units into the skin 3 (three) times daily with meals.  07/02/13   [provider]  insulin detemir (LEVEMIR) 100 UNIT/ML  injection Inject 23 Units into the skin at bedtime. Flex pen    [provider]  lamoTRIgine (LAMICTAL) 150 MG tablet Take 200 mg by mouth daily.     [provider]  loratadine (CLARITIN) 10 MG tablet  06/29/14   [provider]  meloxicam (MOBIC) 15 MG tablet Take 1 tablet (15 mg total) by mouth daily. 06/16/16   Cuthriell, Delorise Royals, PA-C  metFORMIN (GLUCOPHAGE) 500 MG tablet Take 500 mg by mouth 2 (two) times daily.    [provider]  metoCLOPramide (REGLAN) 10 MG tablet Take by mouth 2 (two) times daily.     [provider]  solifenacin (VESICARE) 10 MG tablet Take 10 mg by mouth daily.    [provider]  traZODone (DESYREL) 100 MG tablet Take by mouth.    [provider]    Allergies Amoxicillin and Penicillins  Family History  Problem Relation Age of Onset  . Drug abuse Mother   . Alcohol abuse Mother   . Alcohol abuse Brother   . Drug abuse Brother   . Alcohol abuse Brother     Social History Social History   Tobacco Use  . Smoking status: Current Every Day Smoker    Packs/day: 0.05    Types: Cigarettes    Start date: 02/14/2014  . Smokeless tobacco: Never Used  Substance Use Topics  . Alcohol use: No    Alcohol/week: 0.0 oz  . Drug use: No    Review of Systems  Constitutional: No fever/chills. Eyes: No visual changes. ENT: No sore throat. Cardiovascular: Denies chest pain. Respiratory: Denies shortness of breath. Gastrointestinal: No  abdominal pain.  No nausea, no vomiting.  No diarrhea.  No constipation. Genitourinary: Negative for dysuria. Musculoskeletal: Negative for back pain. Skin: Negative for rash. Neurological: Negative for headaches, focal weakness or numbness. Psychiatric:Positive for statement of self-harm.  ____________________________________________   PHYSICAL EXAM:  VITAL SIGNS: ED Triage Vitals [04/07/18 2312]  Enc Vitals Group     BP (!) 146/89     Pulse Rate 79      Resp 18     Temp 98.7 F (37.1 C)     Temp Source Oral     SpO2 96 %     Weight 236 lb (107 kg)     Height  (1.651 m)     Head Circumference      Peak Flow      Pain Score      Pain Loc      Pain Edu?      Excl. in GC?     Constitutional: Alert and oriented. Well appearing and in no acute distress. Eyes: Conjunctivae are normal. PERRL. EOMI. Head: Atraumatic. Nose: No congestion/rhinnorhea. Mouth/Throat: Mucous membranes are moist.  Oropharynx non-erythematous. Neck: No stridor.   Cardiovascular: Normal rate, regular rhythm. Grossly normal heart sounds.  Good peripheral circulation. Respiratory: Normal respiratory effort.  No retractions. Lungs CTAB. Gastrointestinal: Soft and nontender. No distention. No abdominal bruits. No CVA tenderness. Musculoskeletal: No lower extremity tenderness nor edema.  No joint effusions. Neurologic:  Normal speech and language. No gross focal neurologic deficits are appreciated. No gait instability. Skin:  Skin is warm, dry and intact. No rash noted. Psychiatric: Mood and affect are normal. Speech and behavior are normal.  ____________________________________________   LABS (all labs ordered are listed, but only abnormal results are displayed)  Labs Reviewed  COMPREHENSIVE METABOLIC PANEL - Abnormal; Notable for the following components:      Result Value   Glucose, Bld 167 (*)    Total Protein 8.6 (*)    Total Bilirubin 0.2 (*)    All other components within normal limits  ACETAMINOPHEN LEVEL - Abnormal; Notable for the following components:   Acetaminophen (Tylenol), Serum <10 (*)    All other components within normal limits  ETHANOL  SALICYLATE LEVEL  CBC  URINE DRUG SCREEN, QUALITATIVE ([facility] ONLY)  POC URINE PREG, ED  POCT PREGNANCY, URINE   ____________________________________________  EKG  None ____________________________________________  RADIOLOGY  ED MD interpretation: None  Official radiology report(s): No  results found.  ____________________________________________   PROCEDURES  Procedure(s) performed: None  Procedures  Critical Care performed: No  ____________________________________________   INITIAL IMPRESSION / ASSESSMENT AND PLAN / ED COURSE  As part of my medical decision making, I reviewed the following data within the electronic MEDICAL RECORD NUMBER Nursing notes reviewed and incorporated, Labs reviewed, Old chart reviewed, A consult was requested and obtained from this/these consultant(s) Psychiatry and Notes from prior ED visits   27 year old female with bipolar disorder, schizophrenia who presents to the ED voluntarily after an argument with her roommate in which she reportedly made comments about self-harm.  Patient currently denying active SI/HI.  Laboratory and urinalysis results are unremarkable.  Patient is medically cleared pending psychiatry evaluation.  She will remain in the ED under voluntary status.   Clinical Course as of Apr 08 405  Mon Apr 08, 2018  0405 Patient was evaluated by [facility] psychiatrist Dr. Hermelinda Medicus who deems patient psychiatrically stable for discharge back to group home.  She is to continue her psychiatric medications.  Strict return  precautions given.  Patient verbalizes understanding agrees with plan of care.   [JS]    Clinical Course User Index [JS] Irean Hong, MD     ____________________________________________   FINAL CLINICAL IMPRESSION(S) / ED DIAGNOSES  Final diagnoses:  Bipolar affective disorder, currently depressed, moderate (HCC)  Schizophrenia, unspecified type [facility])     ED Discharge Orders    None       Note:  This document was prepared using Dragon voice recognition software and may include unintentional dictation errors.    Irean Hong, MD 04/08/18 [phone]

## 2018-04-08 NOTE — ED Notes (Signed)
Patient is speaking with S.O.C.  Dr. Alvarado. 

## 2018-04-08 NOTE — ED Notes (Signed)

## 2018-04-08 NOTE — ED Notes (Signed)
Patient is alert and oriented, voices understanding of discharge instructions, as well as caregiver, Patient's belongings given back to her and transportation being provided by group home.
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
## 2018-04-08 NOTE — Discharge Instructions (Addendum)
Take your medicines daily.  Return to the ER for worsening symptoms, feelings of hurting yourself or others, or other concerns.
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
## 2018-04-08 NOTE — ED Notes (Addendum)
Pt is from group home [facility]). Pt states that her mother died in 01-12-2023 about 2 years ago and every year on mother's day she "acts like this". She get very emotional and acts out. She says she only asked her boyfriend for a cigarette because she was stressed out about the death of her mother. Her room mate got upset and because she was smoking and told her that her boyfriend was a bad influence on her. Pt became enraged and began knocking things off the wall and throwing things. Pt shouted that she wanted to but herself (which she expressed was not the truth and that she was just saying it). Pt denies SI. Pt states that she feels like everyone is against her and she is not handling the death of her mother very well. This RN suggested that she find better outlets for her anger other than destruction

## 2019-03-12 ENCOUNTER — Other Ambulatory Visit: Payer: Self-pay

## 2019-03-12 ENCOUNTER — Emergency Department
Admission: EM | Admit: 2019-03-12 | Discharge: 2019-03-13 | Disposition: A | Discharge status: Home / Self Care | Payer: Medicare Other | Attending: Emergency Medicine | Admitting: Emergency Medicine

## 2019-03-12 ENCOUNTER — Encounter: Payer: Self-pay | Admitting: *Deleted

## 2019-03-12 DIAGNOSIS — F4325 Adjustment disorder with mixed disturbance of emotions and conduct: Secondary | ICD-10-CM | POA: Diagnosis present

## 2019-03-12 DIAGNOSIS — E119 Type 2 diabetes mellitus without complications: Secondary | ICD-10-CM | POA: Diagnosis not present

## 2019-03-12 DIAGNOSIS — F17210 Nicotine dependence, cigarettes, uncomplicated: Secondary | ICD-10-CM | POA: Insufficient documentation

## 2019-03-12 DIAGNOSIS — Z794 Long term (current) use of insulin: Secondary | ICD-10-CM | POA: Diagnosis not present

## 2019-03-12 DIAGNOSIS — F99 Mental disorder, not otherwise specified: Secondary | ICD-10-CM | POA: Diagnosis present

## 2019-03-12 DIAGNOSIS — F259 Schizoaffective disorder, unspecified: Secondary | ICD-10-CM | POA: Diagnosis not present

## 2019-03-12 DIAGNOSIS — F319 Bipolar disorder, unspecified: Secondary | ICD-10-CM | POA: Insufficient documentation

## 2019-03-12 DIAGNOSIS — Z79899 Other long term (current) drug therapy: Secondary | ICD-10-CM | POA: Insufficient documentation

## 2019-03-12 DIAGNOSIS — F909 Attention-deficit hyperactivity disorder, unspecified type: Secondary | ICD-10-CM | POA: Insufficient documentation

## 2019-03-12 LAB — URINE DRUG SCREEN, QUALITATIVE (ARMC ONLY)
Amphetamines, Ur Screen: NOT DETECTED
Barbiturates, Ur Screen: NOT DETECTED
Benzodiazepine, Ur Scrn: NOT DETECTED
Cannabinoid 50 Ng, Ur ~~LOC~~: NOT DETECTED
Cocaine Metabolite,Ur ~~LOC~~: NOT DETECTED
MDMA (Ecstasy)Ur Screen: NOT DETECTED
Methadone Scn, Ur: NOT DETECTED
Opiate, Ur Screen: NOT DETECTED
Phencyclidine (PCP) Ur S: NOT DETECTED
Tricyclic, Ur Screen: POSITIVE — AB

## 2019-03-12 LAB — CBC
HCT: 40.1 % (ref 36.0–46.0)
Hemoglobin: 13.1 g/dL (ref 12.0–15.0)
MCH: 27.3 pg (ref 26.0–34.0)
MCHC: 32.7 g/dL (ref 30.0–36.0)
MCV: 83.5 fL (ref 80.0–100.0)
Platelets: 272 10*3/uL (ref 150–400)
RBC: 4.8 MIL/uL (ref 3.87–5.11)
RDW: 12.6 % (ref 11.5–15.5)
WBC: 6.7 10*3/uL (ref 4.0–10.5)
nRBC: 0 % (ref 0.0–0.2)

## 2019-03-12 LAB — COMPREHENSIVE METABOLIC PANEL
ALT: 20 U/L (ref 0–44)
AST: 20 U/L (ref 15–41)
Albumin: 4.5 g/dL (ref 3.5–5.0)
Alkaline Phosphatase: 44 U/L (ref 38–126)
Anion gap: 11 (ref 5–15)
BUN: 12 mg/dL (ref 6–20)
CO2: 24 mmol/L (ref 22–32)
Calcium: 8.8 mg/dL — ABNORMAL LOW (ref 8.9–10.3)
Chloride: 101 mmol/L (ref 98–111)
Creatinine, Ser: 0.67 mg/dL (ref 0.44–1.00)
GFR calc Af Amer: >60 mL/min (ref >60)
GFR calc non Af Amer: >60 mL/min (ref >60)
Glucose, Bld: 119 mg/dL — ABNORMAL HIGH (ref 70–99)
Potassium: 3.5 mmol/L (ref 3.5–5.1)
Sodium: 136 mmol/L (ref 135–145)
Total Bilirubin: 0.3 mg/dL (ref 0.3–1.2)
Total Protein: 8.7 g/dL — ABNORMAL HIGH (ref 6.5–8.1)

## 2019-03-12 LAB — ETHANOL: Alcohol, Ethyl (B): <10 mg/dL (ref <10)

## 2019-03-12 LAB — PREGNANCY, URINE: Preg Test, Ur: NEGATIVE

## 2019-03-12 LAB — GLUCOSE, CAPILLARY: Glucose-Capillary: 104 mg/dL — ABNORMAL HIGH (ref 70–99)

## 2019-03-12 LAB — SALICYLATE LEVEL: Salicylate Lvl: <7 mg/dL (ref 2.8–30.0)

## 2019-03-12 LAB — ACETAMINOPHEN LEVEL: Acetaminophen (Tylenol), Serum: <10 ug/mL — ABNORMAL LOW (ref 10–30)

## 2019-03-12 MED ORDER — METFORMIN HCL 500 MG PO TABS
500.0000 mg | ORAL_TABLET | Freq: Two times a day (BID) | ORAL | Status: DC
  Administered 2019-03-12 – 2019-03-13 (×2): 500 mg via ORAL
  Filled 2019-03-12 (×2): qty 1

## 2019-03-12 MED ORDER — INSULIN DETEMIR 100 UNIT/ML ~~LOC~~ SOLN
23.0000 [IU] | Freq: Every day | SUBCUTANEOUS | Status: DC
  Administered 2019-03-12: 23 [IU] via SUBCUTANEOUS
  Filled 2019-03-12 (×2): qty 0.23

## 2019-03-12 MED ORDER — INSULIN ASPART 100 UNIT/ML ~~LOC~~ SOLN
12.0000 [IU] | Freq: Three times a day (TID) | SUBCUTANEOUS | Status: DC
  Administered 2019-03-13 (×2): 12 [IU] via SUBCUTANEOUS
  Filled 2019-03-12 (×3): qty 1

## 2019-03-12 MED ORDER — LAMOTRIGINE 100 MG PO TABS
200.0000 mg | ORAL_TABLET | Freq: Every day | ORAL | Status: DC
  Administered 2019-03-13: 10:00:00 200 mg via ORAL
  Filled 2019-03-12: qty 2

## 2019-03-12 NOTE — ED Notes (Signed)
Pt. Transferred to [facility] from ED to room after screening for contraband. Report to include Situation, Background, Assessment and Recommendations from Matt RN. Pt. Oriented to unit including Q15 minute rounds as well as the security cameras for their protection. Patient is alert and oriented, warm and dry in no acute distress. Patient denies SI, HI, and AVH. Pt. Encouraged to let me know if needs arise.  

## 2019-03-12 NOTE — ED Notes (Signed)
Pt. States "I became upset today because I have not heard from my brothers in the past two months".  Pt. States "I tried to talk to the group home manager about the problem I was having and she acted like she did not care".  Pt. States she got upset and started throwing things in her room and outside".  Pt. Denies SI/HI.  Pt. Requesting to go to different group home.

## 2019-03-12 NOTE — ED Triage Notes (Signed)
Pt states she is from a group home.  Pt got into an argument with staff and states she wants to talk to someone.  Denies SI or HI.  Denies drug use or etoh.  Pt calm and cooperative.
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
## 2019-03-12 NOTE — ED Provider Notes (Signed)
[facility] Emergency Department Provider Note  ____________________________________________   I have reviewed the triage vital signs and the nursing notes. Where available I have reviewed prior notes and, if possible and indicated, outside hospital notes.    HISTORY  Chief Complaint Behavior Problem    HPI Alexandria Reynolds is a 28 y.o. female  Who states that she has been having trouble with her group home because she is upset about not having recently communicated with her family.  She is also upset that she was not getting enough to eat today.  She states she is diabetic and has not had any food since 3:00.  Cannot verify whether this is true or not.  She has no SI or HI she states she was having some complex and wants to come here instead.  Patient has been seen here before.  He has no audio or visual hallucinations.  Has not been here recently does have a history of schizophrenia and bipolar affective disorder according to notes.    Past Medical History:  Diagnosis Date  . ADHD (attention deficit hyperactivity disorder)   . Bipolar disorder (HCC)   . Diabetes mellitus, type II (HCC)   . Schizoaffective disorder [facility])     Patient Active Problem List   Diagnosis Date Noted  . 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 Developmental disability 08/01/2016  . Bipolar I disorder (HCC) 03/28/2016  . Bipolar affective disorder (HCC) 02/15/2016  . Schizophrenia (HCC) 02/15/2016  . Asthma, mild intermittent 09/19/2013  . Absence of menstruation 07/08/2013  . Type 2 diabetes mellitus (HCC) 07/08/2013  . H/O infectious disease 07/08/2013  . Adiposity 07/08/2013  . Current smoker 07/08/2013    No past surgical history on file.  Prior to Admission medications   Medication Sig Start Date End Date Taking? Authorizing Provider  chlorproMAZINE (THORAZINE) 50 MG tablet Take 100 mg by mouth at bedtime. Given at 8am and 3pm     [provider]  insulin aspart (NOVOLOG) 100 UNIT/ML FlexPen Inject 12 Units into the skin 3 (three) times daily with meals.  07/02/13   [provider]  insulin detemir (LEVEMIR) 100 UNIT/ML injection Inject 23 Units into the skin at bedtime. Flex pen    [provider]  lamoTRIgine (LAMICTAL) 150 MG tablet Take 200 mg by mouth daily.     [provider]  loratadine (CLARITIN) 10 MG tablet  06/29/14   [provider]  meloxicam (MOBIC) 15 MG tablet Take 1 tablet (15 mg total) by mouth daily. 06/16/16   Cuthriell, Delorise Royals, PA-C  metFORMIN (GLUCOPHAGE) 500 MG tablet Take 500 mg by mouth 2 (two) times daily.    [provider]  metoCLOPramide (REGLAN) 10 MG tablet Take by mouth 2 (two) times daily.     [provider]  solifenacin (VESICARE) 10 MG tablet Take 10 mg by mouth daily.    [provider]  traZODone (DESYREL) 100 MG tablet Take by mouth.    [provider]    Allergies Amoxicillin and Penicillins  Family History  Problem Relation Age of Onset  . Drug abuse Mother   . Alcohol abuse Mother   . Alcohol abuse Brother   . Drug abuse Brother   . Alcohol abuse Brother     Social History Social History   Tobacco Use  . Smoking status: Current Every Day Smoker    Packs/day: 0.05    Types: Cigarettes  Start date: 02/14/2014  . Smokeless tobacco: Never Used  Substance Use Topics  . Alcohol use: No    Alcohol/week: 0.0 standard drinks  . Drug use: No    Review of Systems Constitutional: No fever/chills Eyes: No visual changes. ENT: No sore throat. No stiff neck no neck pain Cardiovascular: Denies chest pain. Respiratory: Denies shortness of breath. Gastrointestinal:   no vomiting.  No diarrhea.  No constipation. Genitourinary: Negative for dysuria. Musculoskeletal: Negative lower extremity swelling Skin: Negative for rash. Neurological: Negative for severe headaches, focal weakness or  numbness.   ____________________________________________   PHYSICAL EXAM:  VITAL SIGNS: ED Triage Vitals [03/12/19 2059]  Enc Vitals Group     BP      Pulse      Resp      Temp      Temp src      SpO2      Weight 243 lb (110.2 kg)     Height 5\' 6"  (1.676 m)     Head Circumference      Peak Flow      Pain Score 0     Pain Loc      Pain Edu?      Excl. in GC?     Constitutional: Alert and oriented. Well appearing and in no acute distress. Eyes: Conjunctivae are normal Head: Atraumatic HEENT: No congestion/rhinnorhea. Mucous membranes are moist.  Oropharynx non-erythematous Neck:   Nontender with no meningismus, no masses, no stridor Cardiovascular: Normal rate, regular rhythm. Grossly normal heart sounds.  Good peripheral circulation. Respiratory: Normal respiratory effort.  No retractions. Lungs CTAB. Abdominal: Soft and nontender. No distention. No guarding no rebound Back:  There is no focal tenderness or step off.  there is no midline tenderness there are no lesions noted. there is no CVA tenderness  Musculoskeletal: No lower extremity tenderness, no upper extremity tenderness. No joint effusions, no DVT signs strong distal pulses no edema Neurologic:  Normal speech and language. No gross focal neurologic deficits are appreciated.  Skin:  Skin is warm, dry and intact. No rash noted. Psychiatric: Mood and affect are normal. Speech and behavior are normal.  ____________________________________________   LABS (all labs ordered are listed, but only abnormal results are displayed)  Labs Reviewed  COMPREHENSIVE METABOLIC PANEL - Abnormal; Notable for the following components:      Result Value   Glucose, Bld 119 (*)    Calcium 8.8 (*)    Total Protein 8.7 (*)    All other components within normal limits  ETHANOL  CBC  SALICYLATE LEVEL  ACETAMINOPHEN LEVEL  URINE DRUG SCREEN, QUALITATIVE ([facility] ONLY)  PREGNANCY, URINE    Pertinent labs  results that were  available during my care of the patient were reviewed by me and considered in my medical decision making (see chart for details). ____________________________________________  EKG  I personally interpreted any EKGs ordered by me or triage   ____________________________________________  RADIOLOGY  Pertinent labs & imaging results that were available during my care of the patient were reviewed by me and considered in my medical decision making (see chart for details). If possible, patient and/or family made aware of any abnormal findings.  No results found. ____________________________________________    PROCEDURES  Procedure(s) performed: None  Procedures  Critical Care performed: None  ____________________________________________   INITIAL IMPRESSION / ASSESSMENT AND PLAN / ED COURSE  Pertinent labs & imaging results that were available during my care of the patient were reviewed by me and considered  in my medical decision making (see chart for details).  Patient here voluntarily after some sort of an argument at her group home.  She does not dorsal SI or HI at this time.  We will have her evaluated by psychiatry.    ____________________________________________   FINAL CLINICAL IMPRESSION(S) / ED DIAGNOSES  Final diagnoses:  None      This chart was dictated using voice recognition software.  Despite best efforts to proofread,  errors can occur which can change meaning.      Jeanmarie Plant, MD 03/12/19 2154

## 2019-03-13 LAB — GLUCOSE, CAPILLARY
Glucose-Capillary: 133 mg/dL — ABNORMAL HIGH (ref 70–99)
Glucose-Capillary: 106 mg/dL — ABNORMAL HIGH (ref 70–99)

## 2019-03-13 NOTE — ED Provider Notes (Signed)
The patient has been evaluated at bedside by psychiatry.  Patient is clinically stable.  Not felt to be a danger to self or others.  No SI or Hi.  No indication for inpatient psychiatric admission at this time.  Appropriate for continued outpatient therapy.    Willy Eddy, MD 03/13/19 [phone]

## 2019-03-13 NOTE — ED Notes (Signed)
Hourly rounding reveals patient in room. No complaints, stable, in no acute distress. Q15 minute rounds and monitoring via Security Cameras to continue. 

## 2019-03-13 NOTE — ED Notes (Signed)
Voicemail message left on voicemail of group home director to check on status of staff member that is picking up patient from the ED.

## 2019-03-13 NOTE — ED Notes (Signed)
Call placed to the group home notifying them that the patient has been discharged and was calling to find out what time the patient could be picked up.  Staff member stated that someone would call me back notifying me of what time the patient could be picked up.

## 2019-03-13 NOTE — ED Notes (Signed)
Called placed to patient's legal guardian, [name], notifying him of patient's discharge from the emergency room.

## 2019-03-13 NOTE — ED Notes (Signed)
Lilli Light [id number] [phone], notified that patient was here in the emergency room.

## 2019-03-13 NOTE — Consult Note (Addendum)
[facility] Face-to-Face Psychiatry Consult   Reason for Consult: Agressive Behavior   Referring Physician: Dr. Juliette Alcide Patient Identification: Alexandria Reynolds MRN:  [id number] Principal Diagnosis: Adjustment disorder with mixed disturbance of emotions and conduct Diagnosis:  Principal Problem:   Adjustment disorder with mixed disturbance of emotions and conduct   Total Time spent with patient: 1 hour  Subjective:   Alexandria Reynolds is a 28 y.o. female patient presented to [facility] ED via Loss adjuster, chartered from [facility]. The patient was seen face-to-face by this provider; chart reviewed and consulted with Dr.  Juliette Alcide on 03/12/2019 due to the care of the patient. It was discussed with the provider that the patient does not meet criteria to be admitted to the inpatient unit.   On evaluation the patient is alert and oriented x4, calm and cooperative, and mood-congruent with affect. The patient does not appear to be responding to internal or external stimuli. Neither is the patient presenting with any delusional thinking. The patient denies auditory or visual hallucinations. The patient denies any suicidal, homicidal, or self-harm ideations. The patient is not presenting with any psychotic or paranoid behaviors. During an encounter with the patient, she was able to answer questions appropriately. During the patient assessment she voiced "I am 28 years old and I want to live on my own, I want to meet someone, get married, and have a family of my own."  The patient's discussed, "I do not think that is unreasonable to want a life of my own."  She discussed, living in the group home I am told I will never be anything worthy, I cannot live on my own and I have to be good at all times, yet being told I will never be anything in life."  "I believe in God and I know he is watching over me."  The patient voiced she is compliant with her medications and have mostly done what is asked of her at the  group home. Alexandria Reynolds, discussed having a rough childhood, "does not mean I cannot have a good life." Collateral was obtained by Ms. Valere Dross [phone], who explained the patient have been having behavior issues, throwing stuff and she busted a hole in the  glass door.    Ms. Lyman Bishop did discuss the patient has been at the group home since October 2016.  She voiced that the patient has been doing well.  She did discuss the patient remains compliant with her medications.  Ms. Lyman Bishop was ask if the patient will be accepted back to her home?  She replied, yes; "I am sure but you guys will need to speak to the director Ms. Joycelyn Man [phone]".  Plan: the patient is not a safety risk to herself or others.  Alexandria Reynolds, is not requiring psychiatric inpatient admission for stabilization and treatment.  She will continue on all of her current medications.  The patient guardian should be contacted and start the conversation with him discussing her living independently.  With great support, care and appropriate collaboration this provider believes she can live independently.  HPI: Per Dr. Alphonzo Lemmings: Alexandria Reynolds is a 28 y.o. female  Who states that she has been having trouble with her group home because she is upset about not having recently communicated with her family.  She is also upset that she was not getting enough to eat today.  She states she is diabetic and has not had any food since 3:00.  Cannot verify whether this  is true or not.  She has no SI or HI she states she was having some complex and wants to come here instead.  Patient has been seen here before.  He has no audio or visual hallucinations.  Has not been here recently does have a history of schizophrenia and bipolar affective disorder according to notes.     Past Psychiatric History:  ADHD (attention deficit hyperactivity disorder) Bipolar disorder (HCC) Schizoaffective disorder (HCC)  Risk to Self: Suicidal Ideation:  No Suicidal Intent: No Is patient at risk for suicide?: No Suicidal Plan?: No Access to Means: No What has been your use of drugs/alcohol within the last 12 months?: none  How many times?: 1 Other Self Harm Risks: none  Triggers for Past Attempts: Unpredictable Intentional Self Injurious Behavior: None Risk to Others: Homicidal Ideation: No Thoughts of Harm to Others: No Current Homicidal Intent: No Current Homicidal Plan: No Access to Homicidal Means: No Identified Victim: none  History of harm to others?: No Assessment of Violence: None Noted Violent Behavior Description: throwing objects prior to arrival  Does patient have access to weapons?: No Criminal Charges Pending?: No Does patient have a court date: No Prior Inpatient Therapy: Prior Inpatient Therapy: Yes Prior Therapy Dates: unknown  Prior Therapy Facilty/Provider(s): unknown  Reason for Treatment: Schizophrenia  Prior Outpatient Therapy: Prior Outpatient Therapy: Yes Prior Therapy Dates: current  Prior Therapy Facilty/Provider(s): unknown  Reason for Treatment: Schizophrenia  Does patient have an ACCT team?: No Does patient have Intensive In-House Services?  : No Does patient have [facility] services? : No Does patient have P4CC services?: No  Past Medical History:  Past Medical History:  Diagnosis Date  . ADHD (attention deficit hyperactivity disorder)   . Bipolar disorder (HCC)   . Diabetes mellitus, type II (HCC)   . Schizoaffective disorder (HCC)    No past surgical history on file. Family History:  Family History  Problem Relation Age of Onset  . Drug abuse Mother   . Alcohol abuse Mother   . Alcohol abuse Brother   . Drug abuse Brother   . Alcohol abuse Brother    Family Psychiatric  History: Unknown, the patient was adopted.  Reviewed. Social History:  Social History   Substance and Sexual Activity  Alcohol Use No  . Alcohol/week: 0.0 standard drinks     Social History   Substance and  Sexual Activity  Drug Use No    Social History   Socioeconomic History  . Marital status: Single    Spouse name: Not on file  . Number of children: Not on file  . Years of education: Not on file  . Highest education level: Not on file  Occupational History  . Not on file  Social Needs  . Financial resource strain: Not on file  . Food insecurity:    Worry: Not on file    Inability: Not on file  . Transportation needs:    Medical: Not on file    Non-medical: Not on file  Tobacco Use  . Smoking status: Current Every Day Smoker    Packs/day: 0.05    Types: Cigarettes    Start date: 02/14/2014  . Smokeless tobacco: Never Used  Substance and Sexual Activity  . Alcohol use: No    Alcohol/week: 0.0 standard drinks  . Drug use: No  . Sexual activity: Never  Lifestyle  . Physical activity:    Days per week: Not on file    Minutes per session: Not on file  .  Stress: Not on file  Relationships  . Social connections:    Talks on phone: Not on file    Gets together: Not on file    Attends religious service: Not on file    Active member of club or organization: Not on file    Attends meetings of clubs or organizations: Not on file    Relationship status: Not on file  Other Topics Concern  . Not on file  Social History Narrative  . Not on file   Additional Social History:    Allergies:   Allergies  Allergen Reactions  . Amoxicillin Other (See Comments)  . Penicillins Rash    Labs:  Results for orders placed or performed during the hospital encounter of 03/12/19 (from the past 48 hour(s))  Comprehensive metabolic panel     Status: Abnormal   Collection Time: 03/12/19  9:02 PM  Result Value Ref Range   Sodium 136 135 - 145 mmol/L   Potassium 3.5 3.5 - 5.1 mmol/L   Chloride 101 98 - 111 mmol/L   CO2 24 22 - 32 mmol/L   Glucose, Bld 119 (H) 70 - 99 mg/dL   BUN 12 6 - 20 mg/dL   Creatinine, Ser 1.61 0.44 - 1.00 mg/dL   Calcium 8.8 (L) 8.9 - 10.3 mg/dL   Total  Protein 8.7 (H) 6.5 - 8.1 g/dL   Albumin 4.5 3.5 - 5.0 g/dL   AST 20 15 - 41 U/L   ALT 20 0 - 44 U/L   Alkaline Phosphatase 44 38 - 126 U/L   Total Bilirubin 0.3 0.3 - 1.2 mg/dL   GFR calc non Af Amer >60 >60 mL/min   GFR calc Af Amer >60 >60 mL/min   Anion gap 11 5 - 15    Comment: Performed at [facility], [address] [place], [place] [address]  Ethanol     Status: None   Collection Time: 03/12/19  9:02 PM  Result Value Ref Range   Alcohol, Ethyl (B) <10 <10 mg/dL    Comment: (NOTE) Lowest detectable limit for serum alcohol is 10 mg/dL. For medical purposes only. Performed at [facility], [address] [place], [place] [address]   Salicylate level     Status: None   Collection Time: 03/12/19  9:02 PM  Result Value Ref Range   Salicylate Lvl <7.0 2.8 - 30.0 mg/dL    Comment: Performed at [facility], [address] [place], [place] [address]  Acetaminophen level     Status: Abnormal   Collection Time: 03/12/19  9:02 PM  Result Value Ref Range   Acetaminophen (Tylenol), Serum <10 (L) 10 - 30 ug/mL    Comment: (NOTE) Therapeutic concentrations vary significantly. A range of 10-30 ug/mL  may be an effective concentration for many patients. However, some  are best treated at concentrations outside of this range. Acetaminophen concentrations >150 ug/mL at 4 hours after ingestion  and >50 ug/mL at 12 hours after ingestion are often associated with  toxic reactions. Performed at [facility], [address] [place], [place] [address]   cbc     Status: None   Collection Time: 03/12/19  9:02 PM  Result Value Ref Range   WBC 6.7 4.0 - 10.5 K/uL   RBC 4.80 3.87 - 5.11 MIL/uL   Hemoglobin 13.1 12.0 - 15.0 g/dL   HCT 62.1 30.8 - 65.7 %   MCV 83.5 80.0 - 100.0 fL   MCH 27.3 26.0 -  34.0 pg   MCHC 32.7 30.0 - 36.0 g/dL   RDW 94.0 76.8 - 08.8 %   Platelets 272 150 - 400 K/uL   nRBC 0.0 0.0 - 0.2 %    Comment: Performed at [facility], [address] [place], [place] [address]  Urine Drug Screen, Qualitative     Status: Abnormal   Collection Time: 03/12/19  9:02 PM  Result Value Ref Range   Tricyclic, Ur Screen POSITIVE (A) NONE DETECTED   Amphetamines, Ur Screen NONE DETECTED NONE DETECTED   MDMA (Ecstasy)Ur Screen NONE DETECTED NONE DETECTED   Cocaine Metabolite,Ur [place] NONE DETECTED NONE DETECTED   Opiate, Ur Screen NONE DETECTED NONE DETECTED   Phencyclidine (PCP) Ur S NONE DETECTED NONE DETECTED   Cannabinoid 50 Ng, Ur [place] NONE DETECTED NONE DETECTED   Barbiturates, Ur Screen NONE DETECTED NONE DETECTED   Benzodiazepine, Ur Scrn NONE DETECTED NONE DETECTED   Methadone Scn, Ur NONE DETECTED NONE DETECTED    Comment: (NOTE) Tricyclics + metabolites, urine    Cutoff 1000 ng/mL Amphetamines + metabolites, urine  Cutoff 1000 ng/mL MDMA (Ecstasy), urine              Cutoff 500 ng/mL Cocaine Metabolite, urine          Cutoff 300 ng/mL Opiate + metabolites, urine        Cutoff 300 ng/mL Phencyclidine (PCP), urine         Cutoff 25 ng/mL Cannabinoid, urine                 Cutoff 50 ng/mL Barbiturates + metabolites, urine  Cutoff 200 ng/mL Benzodiazepine, urine              Cutoff 200 ng/mL Methadone, urine                   Cutoff 300 ng/mL The urine drug screen provides only a preliminary, unconfirmed analytical test result and should not be used for non-medical purposes. Clinical consideration and professional judgment should be applied to any positive drug screen result due to possible interfering substances. A more specific alternate chemical method must be used in order to obtain a confirmed analytical result. Gas chromatography / mass spectrometry (GC/MS) is the preferred confirmat ory method. Performed at [facility], [address] [place], [place] [address]   Pregnancy, urine     Status: None   Collection Time: 03/12/19  9:02 PM  Result Value Ref Range   Preg Test, Ur NEGATIVE  NEGATIVE    Comment: Performed at [facility], [address] [place], [place] [address]  Glucose, capillary     Status: Abnormal   Collection Time: 03/12/19  9:52 PM  Result Value Ref Range   Glucose-Capillary 104 (H) 70 - 99 mg/dL    Current Facility-Administered Medications  Medication Dose Route Frequency Provider Last Rate Last Dose  . insulin aspart (novoLOG) injection 12 Units  12 Units Subcutaneous TID WC McShane, James A, MD      . insulin detemir (LEVEMIR) injection 23 Units  23 Units Subcutaneous QHS Jeanmarie Plant, MD   23 Units at 03/12/19 2308  . lamoTRIgine (LAMICTAL) tablet 200 mg  200 mg Oral Daily Jeanmarie Plant, MD      . metFORMIN (GLUCOPHAGE) tablet 500 mg  500 mg Oral BID Jeanmarie Plant, MD   500 mg at 03/12/19 2249   Current Outpatient Medications  Medication Sig Dispense Refill  . chlorproMAZINE (THORAZINE)  50 MG tablet Take 100 mg by mouth at bedtime. Given at 8am and 3pm    . insulin aspart (NOVOLOG) 100 UNIT/ML FlexPen Inject 12 Units into the skin 3 (three) times daily with meals.     . insulin detemir (LEVEMIR) 100 UNIT/ML injection Inject 23 Units into the skin at bedtime. Flex pen    . lamoTRIgine (LAMICTAL) 150 MG tablet Take 200 mg by mouth daily.     Marland Kitchen loratadine (CLARITIN) 10 MG tablet     . meloxicam (MOBIC) 15 MG tablet Take 1 tablet (15 mg total) by mouth daily. 30 tablet 0  . metFORMIN (GLUCOPHAGE) 500 MG tablet Take 500 mg by mouth 2 (two) times daily.    . metoCLOPramide (REGLAN) 10 MG tablet Take by mouth 2 (two) times daily.     . solifenacin (VESICARE) 10 MG tablet Take 10 mg by mouth daily.    . traZODone (DESYREL) 100 MG tablet Take by mouth.      Musculoskeletal: Strength & Muscle Tone: within normal limits Gait & Station: normal Patient leans: N/A  Psychiatric Specialty Exam: Physical Exam  Nursing note and vitals reviewed. Constitutional: She is oriented to person, place, and time. She appears well-developed and  well-nourished.  HENT:  Head: Normocephalic and atraumatic.  Eyes: Pupils are equal, round, and reactive to light. Conjunctivae and EOM are normal.  Neck: Normal range of motion. Neck supple.  Cardiovascular: Normal rate and regular rhythm.  Respiratory: Effort normal and breath sounds normal.  Musculoskeletal: Normal range of motion.  Neurological: She is alert and oriented to person, place, and time. She has normal reflexes.  Skin: Skin is warm and dry.  Psychiatric: Judgment and thought content normal.    Review of Systems  Constitutional: Negative.   HENT: Negative.   Eyes: Negative.   Respiratory: Negative.   Cardiovascular: Negative.   Gastrointestinal: Negative.   Genitourinary: Negative.   Musculoskeletal: Negative.   Skin: Negative.   Neurological: Negative.   Endo/Heme/Allergies: Negative.   Psychiatric/Behavioral: Positive for depression. Negative for suicidal ideas.  All other systems reviewed and are negative.   Blood pressure (!) 145/74, pulse 89, temperature 97.6 F (36.4 C), temperature source Oral, resp. rate 16, height  (1.676 m), weight 110.2 kg, last menstrual period 03/12/2019, SpO2 99 %.Body mass index is 39.22 kg/m.  General Appearance: Fairly Groomed  Eye Contact:  Good  Speech:  Clear and Coherent  Volume:  Normal  Mood:  Depressed  Affect:  Congruent  Thought Process:  Goal Directed  Orientation:  Full (Time, Place, and Person)  Thought Content:  Logical  Suicidal Thoughts:  No  Homicidal Thoughts:  No  Memory:  Recent;   Good  Judgement:  Good  Insight:  Good  Psychomotor Activity:  Normal  Concentration:  Concentration: Good  Recall:  Good  Fund of Knowledge:  Good  Language:  Good  Akathisia:  NA  Handed:  Right  AIMS (if indicated):     Assets:  Desire for Improvement Resilience Social Support Vocational/Educational  ADL's:  Intact  Cognition:  WNL  Sleep:   Well     Treatment Plan Summary: Daily contact with patient to  assess and evaluate symptoms and progress in treatment and Medication management  Disposition: No evidence of imminent risk to self or others at present.   Patient does not meet criteria for psychiatric inpatient admission. Patient does not meet criteria to be admitted and a psychiatric inpatient unit  Catalina Gravel, NP 03/13/2019 5:07  AM

## 2019-03-13 NOTE — Consult Note (Signed)
[facility] Face-to-Face Psychiatry Consult   Reason for Consult: Schizophrenia Referring Physician: Emergency room physician Patient Identification: Alexandria LURLENE Reynolds MRN:  [id number] Principal Diagnosis: Adjustment disorder with mixed disturbance of emotions and conduct Diagnosis:  Principal Problem:   Adjustment disorder with mixed disturbance of emotions and conduct   Total Time spent with patient: 30 minutes  Subjective:   Alexandria Reynolds is a 28 y.o. female patient admitted with schizophrenia.  HPI: Patient is seen and examined.  Patient is a 28 year old female who presented to the [facility] emergency department under involuntary commitment.  The patient has a reported past psychiatric history significant for schizophrenia.  Review of the admission records revealed that she had attempted to talk to the group home manager where she stays the day prior to admission.  She was trying to discuss some areas where the patient had conflicts.  The patient became upset and began to throw things.  She threw a water bottle through a glass door.  The patient stated that this was true.  She stated she got upset because she found out that "I could not trust that person".  She denied any auditory, visual or tactile hallucinations.  She denied any suicidal or homicidal ideation.  Review of the electronic medical record showed a similar circumstance approximately an September 2018.  The patient stated her last psychiatric hospitalization was in approximately 2014.  It appears from a note from yesterday that her psychiatric medications include Thorazine 100 mg p.o. 3 times daily, Lamictal 200 mg p.o. daily and trazodone 100 mg p.o. nightly.  Review of her laboratories revealed a blood sugar this a.m. of 133, and drug screen positive for try cyclic antidepressants (probably the trazodone).  Past Psychiatric History: Patient has a history of schizophrenia.  Risk to Self: Suicidal Ideation:  No Suicidal Intent: No Is patient at risk for suicide?: No Suicidal Plan?: No Access to Means: No What has been your use of drugs/alcohol within the last 12 months?: none  How many times?: 1 Other Self Harm Risks: none  Triggers for Past Attempts: Unpredictable Intentional Self Injurious Behavior: None Risk to Others: Homicidal Ideation: No Thoughts of Harm to Others: No Current Homicidal Intent: No Current Homicidal Plan: No Access to Homicidal Means: No Identified Victim: none  History of harm to others?: No Assessment of Violence: None Noted Violent Behavior Description: throwing objects prior to arrival  Does patient have access to weapons?: No Criminal Charges Pending?: No Does patient have a court date: No Prior Inpatient Therapy: Prior Inpatient Therapy: Yes Prior Therapy Dates: unknown  Prior Therapy Facilty/Provider(s): unknown  Reason for Treatment: Schizophrenia  Prior Outpatient Therapy: Prior Outpatient Therapy: Yes Prior Therapy Dates: current  Prior Therapy Facilty/Provider(s): unknown  Reason for Treatment: Schizophrenia  Does patient have an ACCT team?: No Does patient have Intensive In-House Services?  : No Does patient have [facility] services? : No Does patient have P4CC services?: No  Past Medical History:  Past Medical History:  Diagnosis Date  . ADHD (attention deficit hyperactivity disorder)   . Bipolar disorder (HCC)   . Diabetes mellitus, type II (HCC)   . Schizoaffective disorder (HCC)    No past surgical history on file. Family History:  Family History  Problem Relation Age of Onset  . Drug abuse Mother   . Alcohol abuse Mother   . Alcohol abuse Brother   . Drug abuse Brother   . Alcohol abuse Brother    Family Psychiatric  History: Noncontributory Social  History:  Social History   Substance and Sexual Activity  Alcohol Use No  . Alcohol/week: 0.0 standard drinks     Social History   Substance and Sexual Activity  Drug Use No     Social History   Socioeconomic History  . Marital status: Single    Spouse name: Not on file  . Number of children: Not on file  . Years of education: Not on file  . Highest education level: Not on file  Occupational History  . Not on file  Social Needs  . Financial resource strain: Not on file  . Food insecurity:    Worry: Not on file    Inability: Not on file  . Transportation needs:    Medical: Not on file    Non-medical: Not on file  Tobacco Use  . Smoking status: Current Every Day Smoker    Packs/day: 0.05    Types: Cigarettes    Start date: 02/14/2014  . Smokeless tobacco: Never Used  Substance and Sexual Activity  . Alcohol use: No    Alcohol/week: 0.0 standard drinks  . Drug use: No  . Sexual activity: Never  Lifestyle  . Physical activity:    Days per week: Not on file    Minutes per session: Not on file  . Stress: Not on file  Relationships  . Social connections:    Talks on phone: Not on file    Gets together: Not on file    Attends religious service: Not on file    Active member of club or organization: Not on file    Attends meetings of clubs or organizations: Not on file    Relationship status: Not on file  Other Topics Concern  . Not on file  Social History Narrative  . Not on file   Additional Social History:    Allergies:   Allergies  Allergen Reactions  . Amoxicillin Other (See Comments)  . Penicillins Rash    Labs:  Results for orders placed or performed during the hospital encounter of 03/12/19 (from the past 48 hour(s))  Comprehensive metabolic panel     Status: Abnormal   Collection Time: 03/12/19  9:02 PM  Result Value Ref Range   Sodium 136 135 - 145 mmol/L   Potassium 3.5 3.5 - 5.1 mmol/L   Chloride 101 98 - 111 mmol/L   CO2 24 22 - 32 mmol/L   Glucose, Bld 119 (H) 70 - 99 mg/dL   BUN 12 6 - 20 mg/dL   Creatinine, Ser 9.14 0.44 - 1.00 mg/dL   Calcium 8.8 (L) 8.9 - 10.3 mg/dL   Total Protein 8.7 (H) 6.5 - 8.1 g/dL    Albumin 4.5 3.5 - 5.0 g/dL   AST 20 15 - 41 U/L   ALT 20 0 - 44 U/L   Alkaline Phosphatase 44 38 - 126 U/L   Total Bilirubin 0.3 0.3 - 1.2 mg/dL   GFR calc non Af Amer >60 >60 mL/min   GFR calc Af Amer >60 >60 mL/min   Anion gap 11 5 - 15    Comment: Performed at [facility], [address], [place], [place] [address]  Ethanol     Status: None   Collection Time: 03/12/19  9:02 PM  Result Value Ref Range   Alcohol, Ethyl (B) <10 <10 mg/dL    Comment: (NOTE) Lowest detectable limit for serum alcohol is 10 mg/dL. For medical purposes only. Performed at [facility], [address]  [place], [place] [address]   Salicylate level     Status: None   Collection Time: 03/12/19  9:02 PM  Result Value Ref Range   Salicylate Lvl <7.0 2.8 - 30.0 mg/dL    Comment: Performed at [facility], [address] [place], [place] [address]  Acetaminophen level     Status: Abnormal   Collection Time: 03/12/19  9:02 PM  Result Value Ref Range   Acetaminophen (Tylenol), Serum <10 (L) 10 - 30 ug/mL    Comment: (NOTE) Therapeutic concentrations vary significantly. A range of 10-30 ug/mL  may be an effective concentration for many patients. However, some  are best treated at concentrations outside of this range. Acetaminophen concentrations >150 ug/mL at 4 hours after ingestion  and >50 ug/mL at 12 hours after ingestion are often associated with  toxic reactions. Performed at [facility], [address] [place], [place] [address]   cbc     Status: None   Collection Time: 03/12/19  9:02 PM  Result Value Ref Range   WBC 6.7 4.0 - 10.5 K/uL   RBC 4.80 3.87 - 5.11 MIL/uL   Hemoglobin 13.1 12.0 - 15.0 g/dL   HCT 91.440.1 78.236.0 - 95.646.0 %   MCV 83.5 80.0 - 100.0 fL   MCH 27.3 26.0 - 34.0 pg   MCHC 32.7 30.0 - 36.0 g/dL   RDW 21.312.6 08.611.5 - 57.815.5 %   Platelets 272 150 - 400 K/uL   nRBC 0.0 0.0 - 0.2 %    Comment: Performed at [facility], [address] [place], [place] [address]  Urine Drug Screen, Qualitative     Status: Abnormal   Collection Time: 03/12/19  9:02 PM  Result Value Ref Range   Tricyclic, Ur Screen POSITIVE (A) NONE DETECTED   Amphetamines, Ur Screen NONE DETECTED NONE DETECTED   MDMA (Ecstasy)Ur Screen NONE DETECTED NONE DETECTED   Cocaine Metabolite,Ur Person NONE DETECTED NONE DETECTED   Opiate, Ur Screen NONE DETECTED NONE DETECTED   Phencyclidine (PCP) Ur S NONE DETECTED NONE DETECTED   Cannabinoid 50 Ng, Ur [place] NONE DETECTED NONE DETECTED   Barbiturates, Ur Screen NONE DETECTED NONE DETECTED   Benzodiazepine, Ur Scrn NONE DETECTED NONE DETECTED   Methadone Scn, Ur NONE DETECTED NONE DETECTED    Comment: (NOTE) Tricyclics + metabolites, urine    Cutoff 1000 ng/mL Amphetamines + metabolites, urine  Cutoff 1000 ng/mL MDMA (Ecstasy), urine              Cutoff 500 ng/mL Cocaine Metabolite, urine          Cutoff 300 ng/mL Opiate + metabolites, urine        Cutoff 300 ng/mL Phencyclidine (PCP), urine         Cutoff 25 ng/mL Cannabinoid, urine                 Cutoff 50 ng/mL Barbiturates + metabolites, urine  Cutoff 200 ng/mL Benzodiazepine, urine              Cutoff 200 ng/mL Methadone, urine                   Cutoff 300 ng/mL The urine drug screen provides only a preliminary, unconfirmed analytical test result and should not be used for non-medical purposes. Clinical consideration and professional judgment should be applied to any positive drug screen result due to possible interfering substances. A more specific alternate chemical method must be used in order to obtain a  confirmed analytical result. Gas chromatography / mass spectrometry (GC/MS) is the preferred confirmat ory method. Performed at [facility], [address] [place], [place] [address]   Pregnancy, urine     Status: None   Collection Time: 03/12/19  9:02 PM  Result Value Ref Range   Preg Test, Ur NEGATIVE NEGATIVE    Comment: Performed  at [facility], [address] [place], [place] [address]  Glucose, capillary     Status: Abnormal   Collection Time: 03/12/19  9:52 PM  Result Value Ref Range   Glucose-Capillary 104 (H) 70 - 99 mg/dL  Glucose, capillary     Status: Abnormal   Collection Time: 03/13/19  8:32 AM  Result Value Ref Range   Glucose-Capillary 133 (H) 70 - 99 mg/dL    Current Facility-Administered Medications  Medication Dose Route Frequency Provider Last Rate Last Dose  . insulin aspart (novoLOG) injection 12 Units  12 Units Subcutaneous TID WC Jeanmarie Plant, MD   12 Units at 03/13/19 [phone]  . insulin detemir (LEVEMIR) injection 23 Units  23 Units Subcutaneous QHS Jeanmarie Plant, MD   23 Units at 03/12/19 2308  . lamoTRIgine (LAMICTAL) tablet 200 mg  200 mg Oral Daily Jeanmarie Plant, MD      . metFORMIN (GLUCOPHAGE) tablet 500 mg  500 mg Oral BID Jeanmarie Plant, MD   500 mg at 03/12/19 2249   Current Outpatient Medications  Medication Sig Dispense Refill  . chlorproMAZINE (THORAZINE) 50 MG tablet Take 100 mg by mouth at bedtime. Given at 8am and 3pm    . insulin aspart (NOVOLOG) 100 UNIT/ML FlexPen Inject 12 Units into the skin 3 (three) times daily with meals.     . insulin detemir (LEVEMIR) 100 UNIT/ML injection Inject 23 Units into the skin at bedtime. Flex pen    . lamoTRIgine (LAMICTAL) 150 MG tablet Take 200 mg by mouth daily.     Marland Kitchen loratadine (CLARITIN) 10 MG tablet     . meloxicam (MOBIC) 15 MG tablet Take 1 tablet (15 mg total) by mouth daily. 30 tablet 0  . metFORMIN (GLUCOPHAGE) 500 MG tablet Take 500 mg by mouth 2 (two) times daily.    . metoCLOPramide (REGLAN) 10 MG tablet Take by mouth 2 (two) times daily.     . solifenacin (VESICARE) 10 MG tablet Take 10 mg by mouth daily.    . traZODone (DESYREL) 100 MG tablet Take by mouth.      Musculoskeletal: Strength & Muscle Tone: within normal limits Gait & Station: normal Patient leans: N/A  Psychiatric Specialty  Exam: Physical Exam  Nursing note and vitals reviewed. Constitutional: She is oriented to person, place, and time. She appears well-developed and well-nourished.  HENT:  Head: Normocephalic and atraumatic.  Respiratory: Effort normal.  Neurological: She is alert and oriented to person, place, and time.    ROS  Blood pressure (!) 145/74, pulse 89, temperature 97.6 F (36.4 C), temperature source Oral, resp. rate 16, height 5\' 6"  (1.676 m), weight 110.2 kg, last menstrual period 03/12/2019, SpO2 99 %.Body mass index is 39.22 kg/m.  General Appearance: Casual  Eye Contact:  Good  Speech:  Normal Rate  Volume:  Normal  Mood:  Euthymic  Affect:  Congruent  Thought Process:  Coherent and Descriptions of Associations: Intact  Orientation:  Full (Time, Place, and Person)  Thought Content:  Logical  Suicidal Thoughts:  No  Homicidal Thoughts:  No  Memory:  Immediate;  Fair Recent;   Fair Remote;   Fair  Judgement:  Intact  Insight:  Good  Psychomotor Activity:  Increased  Concentration:  Concentration: Fair and Attention Span: Fair  Recall:  [employer] of Knowledge:  Fair  Language:  Fair  Akathisia:  Negative  Handed:  Right  AIMS (if indicated):     Assets:  Desire for Improvement Resilience  ADL's:  Intact  Cognition:  WNL  Sleep:        Treatment Plan Summary: Daily contact with patient to assess and evaluate symptoms and progress in treatment, Medication management and Plan : Patient is seen and examined.  Patient is a 28 year old female with the above-stated past psychiatric history who is seen in consultation in the emergency department after being involuntarily committed.  She currently does not fulfill criteria for involuntary commitment.  She does not appear to be an imminent risk to herself or others.  We will contact the group home and see if she may return to that facility.  Someone spoke with her about the possibility of a ACTT service, and I think that would be  beneficial.  I would also recommend that her outpatient psychiatrist perhaps write a PRN for either Thorazine or lorazepam in the periods of time when she gets upset to try and diffuse things so that she does not have to return to the emergency department.  Disposition: Patient does not meet criteria for psychiatric inpatient admission.  Antonieta Pert, MD 03/13/2019 8:50 AM

## 2019-03-13 NOTE — ED Notes (Signed)
Legal guardian notified of patient's discharged.  Verbalized understanding of discharge instructions and follow up care.

## 2019-03-13 NOTE — ED Provider Notes (Signed)
-----------------------------------------   4:47 AM on 03/13/2019 -----------------------------------------   Blood pressure (!) 145/74, pulse 89, temperature 97.6 F (36.4 C), temperature source Oral, resp. rate 16, height 5\' 6"  (1.676 m), weight 110.2 kg, last menstrual period 03/12/2019, SpO2 99 %.  The patient is calm and cooperative at this time.  There have been no acute events since the last update.  Awaiting disposition plan from Behavioral Medicine team.    Arnaldo Natal, MD 03/13/19 [phone]

## 2019-03-13 NOTE — ED Notes (Signed)
Another call placed to the group home to check on the status of them picking patient up from the group home.  Stated that they would be to pick up the patient in an hour.

## 2019-03-13 NOTE — BH Assessment (Signed)
Assessment Note  Alexandria Reynolds is an 28 y.o. female. Who presents after being involuntarily committed  by group home staff. Patient reports a history of schizophrenia disorder. She admits to experiencing multiple inpatient hospitalizations although she's unaware of time and location. Patient shares the events that have taken place on today and says that she attempted to talk to the group home manager about a current conflict in her life regarding the absence of her brother. Patient states "she didn't care about it. " Throughout the duration of this conversation the patient became upset and begin to throw things.  She admits to throwing things around her room and throwing a water bottle through a glass door. She states that she ano no point in time had intentions of harming anyone else or herself. Pt. denies any suicidal ideation, plan or intent. Pt. denies the presence of any auditory or visual hallucinations at this time. Patient denies any other medical complaints. She shared that she is compliant with medication and treatment request. There is no indication pt is currently responding to internal stimuli or experiencing delusional thought content.   Diagnosis: Schizophrenia    Past Medical History:  Past Medical History:  Diagnosis Date  . ADHD (attention deficit hyperactivity disorder)   . Bipolar disorder (HCC)   . Diabetes mellitus, type II (HCC)   . Schizoaffective disorder (HCC)     No past surgical history on file.  Family History:  Family History  Problem Relation Age of Onset  . Drug abuse Mother   . Alcohol abuse Mother   . Alcohol abuse Brother   . Drug abuse Brother   . Alcohol abuse Brother     Social History:  reports that she has been smoking cigarettes. She started smoking about 5 years ago. She has been smoking about 0.05 packs per day. She has never used smokeless tobacco. She reports that she does not drink alcohol or use drugs.  Additional Social History:   Alcohol / Drug Use Pain Medications: See PTA Prescriptions: See PTA Over the Counter: See PTA History of alcohol / drug use?: No history of alcohol / drug abuse  CIWA: CIWA-Ar BP: (!) 145/74 Pulse Rate: 89 COWS:    Allergies:  Allergies  Allergen Reactions  . Amoxicillin Other (See Comments)  . Penicillins Rash    Home Medications: (Not in a hospital admission)   OB/GYN Status:  Patient's last menstrual period was 03/12/2019 (exact date).  General Assessment Data Location of Assessment: [facility] ED TTS Assessment: In system Is this a Tele or Face-to-Face Assessment?: Face-to-Face Is this an Initial Assessment or a Re-assessment for this encounter?: Initial Assessment Patient Accompanied by:: Other Language Other than English: No Living Arrangements: Other (Comment) What gender do you identify as?: Female Marital status: Single Living Arrangements: Group Home Can pt return to current living arrangement?: Yes Admission Status: Voluntary Is patient capable of signing voluntary admission?: Yes Referral Source: Other Insurance type: Medicare   Medical Screening Exam [facility] Walk-in ONLY) Medical Exam completed: Yes  Crisis Care Plan Living Arrangements: Group Home Legal Guardian: Other:([name]) Name of Psychiatrist: Dr Ave Filter  Name of Therapist: Mrs Arita Miss   Education Status Is patient currently in school?: No Is the patient employed, unemployed or receiving disability?: Receiving disability income  Risk to self with the past 6 months Suicidal Ideation: No Has patient been a risk to self within the past 6 months prior to admission? : No Suicidal Intent: No Has patient had any suicidal intent within  the past 6 months prior to admission? : No Is patient at risk for suicide?: No Suicidal Plan?: No Has patient had any suicidal plan within the past 6 months prior to admission? : No Access to Means: No What has been your use of drugs/alcohol within the last 12  months?: none  Previous Attempts/Gestures: Yes How many times?: 1 Other Self Harm Risks: none  Triggers for Past Attempts: Unpredictable Intentional Self Injurious Behavior: None Family Suicide History: Unknown Recent stressful life event(s): Conflict (Comment) Persecutory voices/beliefs?: No Depression: No Substance abuse history and/or treatment for substance abuse?: No Suicide prevention information given to non-admitted patients: Not applicable  Risk to Others within the past 6 months Homicidal Ideation: No Does patient have any lifetime risk of violence toward others beyond the six months prior to admission? : No Thoughts of Harm to Others: No Current Homicidal Intent: No Current Homicidal Plan: No Access to Homicidal Means: No Identified Victim: none  History of harm to others?: No Assessment of Violence: None Noted Violent Behavior Description: throwing objects prior to arrival  Does patient have access to weapons?: No Criminal Charges Pending?: No Does patient have a court date: No Is patient on probation?: No  Psychosis Hallucinations: None noted Delusions: None noted  Mental Status Report Appearance/Hygiene: In scrubs Eye Contact: Poor Motor Activity: Freedom of movement Speech: Logical/coherent Level of Consciousness: Alert Mood: Preoccupied Affect: Appropriate to circumstance Anxiety Level: Minimal Thought Processes: Circumstantial Judgement: Partial Orientation: Time, Place, Person, Situation Obsessive Compulsive Thoughts/Behaviors: None  Cognitive Functioning Concentration: Fair Memory: Remote Intact, Recent Intact Is patient IDD: No Insight: Fair Impulse Control: Fair Appetite: Fair Have you had any weight changes? : No Change Sleep: No Change Total Hours of Sleep: 6 Vegetative Symptoms: None  ADLScreening [facility] Assessment Services) Patient's cognitive ability adequate to safely complete daily activities?: Yes Patient able to express need for  assistance with ADLs?: Yes Independently performs ADLs?: Yes (appropriate for developmental age)  Prior Inpatient Therapy Prior Inpatient Therapy: Yes Prior Therapy Dates: unknown  Prior Therapy Facilty/Provider(s): unknown  Reason for Treatment: Schizophrenia   Prior Outpatient Therapy Prior Outpatient Therapy: Yes Prior Therapy Dates: current  Prior Therapy Facilty/Provider(s): unknown  Reason for Treatment: Schizophrenia  Does patient have an ACCT team?: No Does patient have Intensive In-House Services?  : No Does patient have [facility] services? : No Does patient have P4CC services?: No  ADL Screening (condition at time of admission) Patient's cognitive ability adequate to safely complete daily activities?: Yes Patient able to express need for assistance with ADLs?: Yes Independently performs ADLs?: Yes (appropriate for developmental age)       Abuse/Neglect Assessment (Assessment to be complete while patient is alone) Abuse/Neglect Assessment Can Be Completed: Yes Physical Abuse: Denies Verbal Abuse: Denies Sexual Abuse: Denies Exploitation of patient/patient's resources: Denies Self-Neglect: Denies Values / Beliefs Cultural Requests During Hospitalization: None Spiritual Requests During Hospitalization: None Consults Spiritual Care Consult Needed: No Social Work Consult Needed: No Merchant navy officerAdvance Directives (For Healthcare) Does Patient Have a Medical Advance Directive?: No          Disposition:  Disposition Initial Assessment Completed for this Encounter: Yes Disposition of Patient: Discharge Patient refused recommended treatment: No Mode of transportation if patient is discharged/movement?: Other (comment) Patient referred to: (Back to Group Home )  On Site Evaluation by:   Reviewed with Physician:    Asa SaunasShawanna N Jotham Ahn 03/13/2019 1:55 AM

## 2019-11-22 ENCOUNTER — Emergency Department
Admission: EM | Admit: 2019-11-22 | Discharge: 2019-11-23 | Disposition: A | Discharge status: Home / Self Care | Payer: Medicare Other | Attending: Emergency Medicine | Admitting: Emergency Medicine

## 2019-11-22 ENCOUNTER — Encounter: Payer: Self-pay | Admitting: Emergency Medicine

## 2019-11-22 ENCOUNTER — Other Ambulatory Visit: Payer: Self-pay

## 2019-11-22 DIAGNOSIS — Z794 Long term (current) use of insulin: Secondary | ICD-10-CM | POA: Diagnosis not present

## 2019-11-22 DIAGNOSIS — Z79899 Other long term (current) drug therapy: Secondary | ICD-10-CM | POA: Insufficient documentation

## 2019-11-22 DIAGNOSIS — E119 Type 2 diabetes mellitus without complications: Secondary | ICD-10-CM | POA: Insufficient documentation

## 2019-11-22 DIAGNOSIS — F17210 Nicotine dependence, cigarettes, uncomplicated: Secondary | ICD-10-CM | POA: Diagnosis not present

## 2019-11-22 DIAGNOSIS — N730 Acute parametritis and pelvic cellulitis: Secondary | ICD-10-CM | POA: Diagnosis not present

## 2019-11-22 DIAGNOSIS — Z20828 Contact with and (suspected) exposure to other viral communicable diseases: Secondary | ICD-10-CM | POA: Diagnosis not present

## 2019-11-22 DIAGNOSIS — R455 Hostility: Secondary | ICD-10-CM | POA: Diagnosis present

## 2019-11-22 DIAGNOSIS — J45909 Unspecified asthma, uncomplicated: Secondary | ICD-10-CM | POA: Diagnosis not present

## 2019-11-22 LAB — URINALYSIS, COMPLETE (UACMP) WITH MICROSCOPIC
Bilirubin Urine: NEGATIVE
Glucose, UA: NEGATIVE mg/dL
Hgb urine dipstick: NEGATIVE
Ketones, ur: NEGATIVE mg/dL
Leukocytes,Ua: NEGATIVE
Nitrite: NEGATIVE
Protein, ur: NEGATIVE mg/dL
Specific Gravity, Urine: 1.015 (ref 1.005–1.030)
pH: 6 (ref 5.0–8.0)

## 2019-11-22 LAB — COMPREHENSIVE METABOLIC PANEL
ALT: 18 U/L (ref 0–44)
AST: 17 U/L (ref 15–41)
Albumin: 4.3 g/dL (ref 3.5–5.0)
Alkaline Phosphatase: 46 U/L (ref 38–126)
Anion gap: 10 (ref 5–15)
BUN: 14 mg/dL (ref 6–20)
CO2: 24 mmol/L (ref 22–32)
Calcium: 9.4 mg/dL (ref 8.9–10.3)
Chloride: 105 mmol/L (ref 98–111)
Creatinine, Ser: 0.67 mg/dL (ref 0.44–1.00)
GFR calc Af Amer: >60 mL/min (ref >60)
GFR calc non Af Amer: >60 mL/min (ref >60)
Glucose, Bld: 157 mg/dL — ABNORMAL HIGH (ref 70–99)
Potassium: 3.8 mmol/L (ref 3.5–5.1)
Sodium: 139 mmol/L (ref 135–145)
Total Bilirubin: 0.3 mg/dL (ref 0.3–1.2)
Total Protein: 8.5 g/dL — ABNORMAL HIGH (ref 6.5–8.1)

## 2019-11-22 LAB — WET PREP, GENITAL
Clue Cells Wet Prep HPF POC: NONE SEEN
Sperm: NONE SEEN
Trich, Wet Prep: NONE SEEN
Yeast Wet Prep HPF POC: NONE SEEN

## 2019-11-22 LAB — CBC WITH DIFFERENTIAL/PLATELET
Abs Immature Granulocytes: 0.01 10*3/uL (ref 0.00–0.07)
Basophils Absolute: 0 10*3/uL (ref 0.0–0.1)
Basophils Relative: 0 %
Eosinophils Absolute: 0 10*3/uL (ref 0.0–0.5)
Eosinophils Relative: 0 %
HCT: 38 % (ref 36.0–46.0)
Hemoglobin: 12.9 g/dL (ref 12.0–15.0)
Immature Granulocytes: 0 %
Lymphocytes Relative: 49 %
Lymphs Abs: 3.1 10*3/uL (ref 0.7–4.0)
MCH: 27.3 pg (ref 26.0–34.0)
MCHC: 33.9 g/dL (ref 30.0–36.0)
MCV: 80.3 fL (ref 80.0–100.0)
Monocytes Absolute: 0.3 10*3/uL (ref 0.1–1.0)
Monocytes Relative: 5 %
Neutro Abs: 3 10*3/uL (ref 1.7–7.7)
Neutrophils Relative %: 46 %
Platelets: 236 10*3/uL (ref 150–400)
RBC: 4.73 MIL/uL (ref 3.87–5.11)
RDW: 13 % (ref 11.5–15.5)
WBC: 6.4 10*3/uL (ref 4.0–10.5)
nRBC: 0 % (ref 0.0–0.2)

## 2019-11-22 LAB — RESPIRATORY PANEL BY RT PCR (FLU A&B, COVID)
Influenza A by PCR: NEGATIVE
Influenza B by PCR: NEGATIVE
SARS Coronavirus 2 by RT PCR: NEGATIVE

## 2019-11-22 LAB — URINE DRUG SCREEN, QUALITATIVE (ARMC ONLY)
Amphetamines, Ur Screen: NOT DETECTED
Barbiturates, Ur Screen: NOT DETECTED
Benzodiazepine, Ur Scrn: NOT DETECTED
Cannabinoid 50 Ng, Ur ~~LOC~~: NOT DETECTED
Cocaine Metabolite,Ur ~~LOC~~: NOT DETECTED
MDMA (Ecstasy)Ur Screen: NOT DETECTED
Methadone Scn, Ur: NOT DETECTED
Opiate, Ur Screen: NOT DETECTED
Phencyclidine (PCP) Ur S: NOT DETECTED
Tricyclic, Ur Screen: NOT DETECTED

## 2019-11-22 LAB — ETHANOL: Alcohol, Ethyl (B): <10 mg/dL (ref <10)

## 2019-11-22 LAB — POCT PREGNANCY, URINE: Preg Test, Ur: NEGATIVE

## 2019-11-22 MED ORDER — DOXYCYCLINE MONOHYDRATE 100 MG PO TABS: 100.0000 mg | ORAL_TABLET | Freq: Two times a day (BID) | ORAL | 0 refills | Status: AC

## 2019-11-22 MED ORDER — CEFTRIAXONE SODIUM 250 MG IJ SOLR
250.0000 mg | Freq: Once | INTRAMUSCULAR | Status: AC
  Administered 2019-11-22: 250 mg via INTRAMUSCULAR
  Filled 2019-11-22: qty 250

## 2019-11-22 NOTE — ED Notes (Signed)
This RN spoke with Alexandria Reynolds patients legal guardian who states he is not allowed to transport the patient but he will attempt to speak with [name] to pick up the patient.
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
## 2019-11-22 NOTE — Discharge Instructions (Addendum)
No signs of yeast infection will cover you for possible pelvic infection. Take the antibiotics.   Return to the ER for any other concerns.
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
## 2019-11-23 NOTE — ED Notes (Signed)
This RN spoke with Ms Philipp Ovens, owner of Group home who states she can not come to pick up the patient and she told this RN to call the patients legal guardian and they would come pick her up.

## 2019-11-25 LAB — GC/CHLAMYDIA PROBE AMP
Chlamydia trachomatis, NAA: NEGATIVE
Neisseria Gonorrhoeae by PCR: NEGATIVE

## 2020-01-12 ENCOUNTER — Emergency Department
Admission: EM | Admit: 2020-01-12 | Discharge: 2020-01-13 | Disposition: A | Discharge status: Home / Self Care | Payer: Medicare Other | Attending: Emergency Medicine | Admitting: Emergency Medicine

## 2020-01-12 ENCOUNTER — Other Ambulatory Visit: Payer: Self-pay

## 2020-01-12 ENCOUNTER — Encounter: Payer: Self-pay | Admitting: *Deleted

## 2020-01-12 DIAGNOSIS — Z79899 Other long term (current) drug therapy: Secondary | ICD-10-CM | POA: Insufficient documentation

## 2020-01-12 DIAGNOSIS — F17200 Nicotine dependence, unspecified, uncomplicated: Secondary | ICD-10-CM | POA: Diagnosis present

## 2020-01-12 DIAGNOSIS — F4320 Adjustment disorder, unspecified: Secondary | ICD-10-CM | POA: Insufficient documentation

## 2020-01-12 DIAGNOSIS — F209 Schizophrenia, unspecified: Secondary | ICD-10-CM | POA: Diagnosis present

## 2020-01-12 DIAGNOSIS — F17210 Nicotine dependence, cigarettes, uncomplicated: Secondary | ICD-10-CM | POA: Insufficient documentation

## 2020-01-12 DIAGNOSIS — E119 Type 2 diabetes mellitus without complications: Secondary | ICD-10-CM | POA: Diagnosis not present

## 2020-01-12 DIAGNOSIS — E669 Obesity, unspecified: Secondary | ICD-10-CM | POA: Diagnosis present

## 2020-01-12 DIAGNOSIS — Z794 Long term (current) use of insulin: Secondary | ICD-10-CM | POA: Insufficient documentation

## 2020-01-12 DIAGNOSIS — N912 Amenorrhea, unspecified: Secondary | ICD-10-CM | POA: Diagnosis present

## 2020-01-12 DIAGNOSIS — F203 Undifferentiated schizophrenia: Secondary | ICD-10-CM | POA: Insufficient documentation

## 2020-01-12 DIAGNOSIS — F319 Bipolar disorder, unspecified: Secondary | ICD-10-CM | POA: Diagnosis present

## 2020-01-12 DIAGNOSIS — R4689 Other symptoms and signs involving appearance and behavior: Secondary | ICD-10-CM | POA: Diagnosis present

## 2020-01-12 DIAGNOSIS — F909 Attention-deficit hyperactivity disorder, unspecified type: Secondary | ICD-10-CM | POA: Insufficient documentation

## 2020-01-12 DIAGNOSIS — Z8619 Personal history of other infectious and parasitic diseases: Secondary | ICD-10-CM | POA: Diagnosis present

## 2020-01-12 DIAGNOSIS — F4325 Adjustment disorder with mixed disturbance of emotions and conduct: Secondary | ICD-10-CM | POA: Diagnosis present

## 2020-01-12 DIAGNOSIS — E1165 Type 2 diabetes mellitus with hyperglycemia: Secondary | ICD-10-CM

## 2020-01-12 DIAGNOSIS — J4520 Mild intermittent asthma, uncomplicated: Secondary | ICD-10-CM | POA: Diagnosis present

## 2020-01-12 DIAGNOSIS — F250 Schizoaffective disorder, bipolar type: Secondary | ICD-10-CM | POA: Insufficient documentation

## 2020-01-12 DIAGNOSIS — F89 Unspecified disorder of psychological development: Secondary | ICD-10-CM | POA: Diagnosis present

## 2020-01-12 LAB — CBC
HCT: 38.3 % (ref 36.0–46.0)
Hemoglobin: 12.4 g/dL (ref 12.0–15.0)
MCH: 27.3 pg (ref 26.0–34.0)
MCHC: 32.4 g/dL (ref 30.0–36.0)
MCV: 84.4 fL (ref 80.0–100.0)
Platelets: 262 10*3/uL (ref 150–400)
RBC: 4.54 MIL/uL (ref 3.87–5.11)
RDW: 12.9 % (ref 11.5–15.5)
WBC: 5.4 10*3/uL (ref 4.0–10.5)
nRBC: 0 % (ref 0.0–0.2)

## 2020-01-12 LAB — URINE DRUG SCREEN, QUALITATIVE (ARMC ONLY)
Amphetamines, Ur Screen: NOT DETECTED
Barbiturates, Ur Screen: NOT DETECTED
Benzodiazepine, Ur Scrn: NOT DETECTED
Cannabinoid 50 Ng, Ur ~~LOC~~: NOT DETECTED
Cocaine Metabolite,Ur ~~LOC~~: NOT DETECTED
MDMA (Ecstasy)Ur Screen: NOT DETECTED
Methadone Scn, Ur: NOT DETECTED
Opiate, Ur Screen: NOT DETECTED
Phencyclidine (PCP) Ur S: NOT DETECTED
Tricyclic, Ur Screen: POSITIVE — AB

## 2020-01-12 LAB — COMPREHENSIVE METABOLIC PANEL
ALT: 17 U/L (ref 0–44)
AST: 19 U/L (ref 15–41)
Albumin: 4 g/dL (ref 3.5–5.0)
Alkaline Phosphatase: 46 U/L (ref 38–126)
Anion gap: 5 (ref 5–15)
BUN: 11 mg/dL (ref 6–20)
CO2: 28 mmol/L (ref 22–32)
Calcium: 9.1 mg/dL (ref 8.9–10.3)
Chloride: 106 mmol/L (ref 98–111)
Creatinine, Ser: 0.64 mg/dL (ref 0.44–1.00)
GFR calc Af Amer: >60 mL/min (ref >60)
GFR calc non Af Amer: >60 mL/min (ref >60)
Glucose, Bld: 96 mg/dL (ref 70–99)
Potassium: 3.5 mmol/L (ref 3.5–5.1)
Sodium: 139 mmol/L (ref 135–145)
Total Bilirubin: 0.3 mg/dL (ref 0.3–1.2)
Total Protein: 8 g/dL (ref 6.5–8.1)

## 2020-01-12 LAB — SALICYLATE LEVEL: Salicylate Lvl: <7 mg/dL — ABNORMAL LOW (ref 7.0–30.0)

## 2020-01-12 LAB — ACETAMINOPHEN LEVEL: Acetaminophen (Tylenol), Serum: <10 ug/mL — ABNORMAL LOW (ref 10–30)

## 2020-01-12 LAB — ETHANOL: Alcohol, Ethyl (B): <10 mg/dL (ref <10)

## 2020-01-12 LAB — POCT PREGNANCY, URINE: Preg Test, Ur: NEGATIVE
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
## 2020-01-12 NOTE — ED Notes (Signed)
Patient given a snack tray and something to drink.  °

## 2020-01-12 NOTE — ED Notes (Signed)
poct pregnancy Negative 
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
## 2020-01-13 NOTE — ED Notes (Signed)
Pt ambulatory to toilet. Provided something to drink.

## 2020-01-13 NOTE — ED Notes (Signed)
This RN spoke to Mrs. Blackwell and informed that this Rn will update when pt is going to be DC

## 2020-01-13 NOTE — ED Provider Notes (Signed)
Procedures     ----------------------------------------- 11:02 AM on 01/13/2020 -----------------------------------------   Discussed with psychiatry who has rescinded the IVC, feel she is stable for discharge back to group home.   Sharman Cheek, MD 01/13/20 1102

## 2020-01-16 ENCOUNTER — Ambulatory Visit (HOSPITAL_COMMUNITY)
Admission: RE | Admit: 2020-01-16 | Discharge: 2020-01-16 | Disposition: A | Discharge status: Home / Self Care | Payer: Medicare Other | Attending: Psychiatry | Admitting: Psychiatry

## 2020-01-16 ENCOUNTER — Encounter (HOSPITAL_COMMUNITY): Payer: Self-pay | Admitting: Psychiatry

## 2020-01-16 DIAGNOSIS — Z79899 Other long term (current) drug therapy: Secondary | ICD-10-CM | POA: Insufficient documentation

## 2020-01-16 DIAGNOSIS — Z1389 Encounter for screening for other disorder: Secondary | ICD-10-CM | POA: Insufficient documentation

## 2020-01-16 DIAGNOSIS — R454 Irritability and anger: Secondary | ICD-10-CM | POA: Diagnosis not present
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
## 2020-02-01 ENCOUNTER — Ambulatory Visit: Payer: Medicare Other | Attending: Internal Medicine

## 2020-02-01 DIAGNOSIS — Z23 Encounter for immunization: Secondary | ICD-10-CM

## 2020-02-01 NOTE — Progress Notes (Signed)
   Covid-19 Vaccination Clinic  Name:  Alexandria Reynolds    MRN: [id number] DOB: 09-14-1991  02/01/2020  [name] was observed post Covid-19 immunization for 15 minutes without incident. She was provided with Vaccine Information Sheet and instruction to access the V-Safe system.   [name] was instructed to call 911 with any severe reactions post vaccine: Marland Kitchen Difficulty breathing  . Swelling of face and throat  . A fast heartbeat  . A bad rash all over body  . Dizziness and weakness   Immunizations Administered    Name Date Dose VIS Date Route   Pfizer COVID-19 Vaccine 02/01/2020 12:30 PM 0.3 mL 11/07/2019 Intramuscular   Manufacturer: [employer], [employer]   Lot: [id number]   NDC: [address]-8

## 2020-02-24 ENCOUNTER — Ambulatory Visit: Payer: Medicare Other | Attending: Internal Medicine

## 2020-02-24 DIAGNOSIS — Z23 Encounter for immunization: Secondary | ICD-10-CM

## 2020-02-24 NOTE — Progress Notes (Signed)
   Covid-19 Vaccination Clinic  Name:  KHAMYA TOPP    MRN: [id number] DOB: 11-30-1990  02/24/2020  Ms. Dechellis was observed post Covid-19 immunization for 15 minutes without incident. She was provided with Vaccine Information Sheet and instruction to access the V-Safe system.   Ms. Knoop was instructed to call 911 with any severe reactions post vaccine: Marland Kitchen Difficulty breathing  . Swelling of face and throat  . A fast heartbeat  . A bad rash all over body  . Dizziness and weakness   Immunizations Administered    Name Date Dose VIS Date Route   Pfizer COVID-19 Vaccine 02/24/2020 10:25 AM 0.3 mL 11/07/2019 Intramuscular   Manufacturer: [employer], [employer]   Lot: [id number] [id number]   NDC: [address]-2

## 2020-05-31 ENCOUNTER — Emergency Department
Admission: EM | Admit: 2020-05-31 | Discharge: 2020-05-31 | Disposition: A | Discharge status: Home / Self Care | Payer: Medicare Other | Attending: Emergency Medicine | Admitting: Emergency Medicine

## 2020-05-31 ENCOUNTER — Other Ambulatory Visit: Payer: Self-pay

## 2020-05-31 ENCOUNTER — Encounter: Payer: Self-pay | Admitting: Emergency Medicine

## 2020-05-31 DIAGNOSIS — Z20822 Contact with and (suspected) exposure to covid-19: Secondary | ICD-10-CM | POA: Insufficient documentation

## 2020-05-31 DIAGNOSIS — Z794 Long term (current) use of insulin: Secondary | ICD-10-CM | POA: Insufficient documentation

## 2020-05-31 DIAGNOSIS — F17210 Nicotine dependence, cigarettes, uncomplicated: Secondary | ICD-10-CM | POA: Diagnosis not present

## 2020-05-31 DIAGNOSIS — E119 Type 2 diabetes mellitus without complications: Secondary | ICD-10-CM | POA: Insufficient documentation

## 2020-05-31 DIAGNOSIS — F259 Schizoaffective disorder, unspecified: Secondary | ICD-10-CM | POA: Diagnosis not present

## 2020-05-31 DIAGNOSIS — Z79899 Other long term (current) drug therapy: Secondary | ICD-10-CM | POA: Diagnosis not present

## 2020-05-31 DIAGNOSIS — R451 Restlessness and agitation: Secondary | ICD-10-CM | POA: Diagnosis present

## 2020-05-31 DIAGNOSIS — F319 Bipolar disorder, unspecified: Secondary | ICD-10-CM | POA: Diagnosis present

## 2020-05-31 DIAGNOSIS — F209 Schizophrenia, unspecified: Secondary | ICD-10-CM | POA: Diagnosis present

## 2020-05-31 DIAGNOSIS — F4325 Adjustment disorder with mixed disturbance of emotions and conduct: Secondary | ICD-10-CM | POA: Diagnosis present

## 2020-05-31 LAB — COMPREHENSIVE METABOLIC PANEL
ALT: 17 U/L (ref 0–44)
AST: 17 U/L (ref 15–41)
Albumin: 4.1 g/dL (ref 3.5–5.0)
Alkaline Phosphatase: 50 U/L (ref 38–126)
Anion gap: 7 (ref 5–15)
BUN: 11 mg/dL (ref 6–20)
CO2: 26 mmol/L (ref 22–32)
Calcium: 9 mg/dL (ref 8.9–10.3)
Chloride: 105 mmol/L (ref 98–111)
Creatinine, Ser: 0.81 mg/dL (ref 0.44–1.00)
GFR calc Af Amer: >60 mL/min (ref >60)
GFR calc non Af Amer: >60 mL/min (ref >60)
Glucose, Bld: 67 mg/dL — ABNORMAL LOW (ref 70–99)
Potassium: 3.5 mmol/L (ref 3.5–5.1)
Sodium: 138 mmol/L (ref 135–145)
Total Bilirubin: 0.4 mg/dL (ref 0.3–1.2)
Total Protein: 8.6 g/dL — ABNORMAL HIGH (ref 6.5–8.1)

## 2020-05-31 LAB — URINE DRUG SCREEN, QUALITATIVE (ARMC ONLY)
Amphetamines, Ur Screen: NOT DETECTED
Barbiturates, Ur Screen: NOT DETECTED
Benzodiazepine, Ur Scrn: NOT DETECTED
Cannabinoid 50 Ng, Ur ~~LOC~~: NOT DETECTED
Cocaine Metabolite,Ur ~~LOC~~: NOT DETECTED
MDMA (Ecstasy)Ur Screen: NOT DETECTED
Methadone Scn, Ur: NOT DETECTED
Opiate, Ur Screen: NOT DETECTED
Phencyclidine (PCP) Ur S: NOT DETECTED
Tricyclic, Ur Screen: POSITIVE — AB

## 2020-05-31 LAB — CBC
HCT: 39.6 % (ref 36.0–46.0)
Hemoglobin: 13 g/dL (ref 12.0–15.0)
MCH: 27.6 pg (ref 26.0–34.0)
MCHC: 32.8 g/dL (ref 30.0–36.0)
MCV: 84.1 fL (ref 80.0–100.0)
Platelets: 250 10*3/uL (ref 150–400)
RBC: 4.71 MIL/uL (ref 3.87–5.11)
RDW: 12.9 % (ref 11.5–15.5)
WBC: 5 10*3/uL (ref 4.0–10.5)
nRBC: 0 % (ref 0.0–0.2)

## 2020-05-31 LAB — POCT PREGNANCY, URINE: Preg Test, Ur: NEGATIVE

## 2020-05-31 LAB — SARS CORONAVIRUS 2 BY RT PCR (HOSPITAL ORDER, PERFORMED IN ~~LOC~~ HOSPITAL LAB): SARS Coronavirus 2: NEGATIVE

## 2020-05-31 LAB — GLUCOSE, CAPILLARY: Glucose-Capillary: 121 mg/dL — ABNORMAL HIGH (ref 70–99)

## 2020-05-31 LAB — ACETAMINOPHEN LEVEL: Acetaminophen (Tylenol), Serum: <10 ug/mL — ABNORMAL LOW (ref 10–30)

## 2020-05-31 LAB — SALICYLATE LEVEL: Salicylate Lvl: <7 mg/dL — ABNORMAL LOW (ref 7.0–30.0)

## 2020-05-31 LAB — ETHANOL: Alcohol, Ethyl (B): <10 mg/dL (ref <10)

## 2020-05-31 MED ORDER — IBUPROFEN 600 MG PO TABS: 600.0000 mg | ORAL_TABLET | Freq: Three times a day (TID) | ORAL | Status: DC | PRN

## 2020-05-31 MED ORDER — ONDANSETRON HCL 4 MG PO TABS: 4.0000 mg | ORAL_TABLET | Freq: Three times a day (TID) | ORAL | Status: DC | PRN

## 2020-05-31 MED ORDER — ALUM & MAG HYDROXIDE-SIMETH 200-200-20 MG/5ML PO SUSP: 30.0000 mL | Freq: Four times a day (QID) | ORAL | Status: DC | PRN
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
## 2020-05-31 NOTE — ED Notes (Signed)
Patient offered a snack and something to drink; Patient refused.

## 2020-05-31 NOTE — ED Notes (Signed)
Psych NP to see patient in interview room.

## 2020-05-31 NOTE — BH Assessment (Signed)
Assessment Note  Alexandria Reynolds is an 29 y.o. female who presents to the ER from Group Home, [facility], following altercation with a staff member. Per the patient, she asked the staff member for her ID. According to her, she asked her nicely the first time and the staff member told her she would have to wait until she completed what she was working on. The patient asked her again and she states, the staff member start yelling at her. The patient start yelling as well and the staff member pushed her. Thus, the patient got upset and push several things over and on the floor.  Per the Group Home ([phone]), today (05/31/2020), the patient asked for her ID but when the staff member told her she had to wait, she became upset and start breaking things. For several weeks the patient has been obsessed with her ID. Shes asked for it, every other day, so she can take pictures of it.  It resulted in the staff member having to put her in a therapeutic hold. Staff further reports, her behaviors have worsened within the last three months. For the past two weeks, every other day the patient is arguing with different staff members. Patient seen her psychiatric outpatient provider and was started on Latuda 05/19/2020, to help with the changes. Staff states, she believes the medications has caused her to worsen instead of improving.   Per the patients Guardian, with [employer] ([name]-[phone]), the patient behaviors have changed within the last several weeks. He believes its because the patients sister recently became involved in her life. Approximately a month ago, the patient spent the weekend with the sister and when she returned to the Group Home, she was requesting to live with the sister. Which wasnt allowed. More recently the patient hasnt spoken with the sister. Patient states, its been approximately two weeks since she last spoken with her. Patient told  this writer it was the group home who caused the sister to stop talking with her, because she believed the lies they told her. However, the guardian reports, the group home has made multiple attempts to contact the sister for the patient, but she doesnt answer. Guardian further reports, whenever the patient feels neglected or abandoned she take it out on the people thats closes to her.  During the interview, the patient was calm, cooperative and pleasant. She was able to provide appropriate answers to the questions. Throughout the interview, the she denied SI/HI and AV/H. She denies the use of mind-altering substances.  Per documentation (Psycholgoical) from Group Home, patient [id number].  Diagnosis: Bipolar  Past Medical History:  Past Medical History:  Diagnosis Date   ADHD (attention deficit hyperactivity disorder)    Bipolar disorder (HCC)    Diabetes mellitus, type II (HCC)    Schizoaffective disorder (HCC)     History reviewed. No pertinent surgical history.  Family History:  Family History  Problem Relation Age of Onset   Drug abuse Mother    Alcohol abuse Mother    Alcohol abuse Brother    Drug abuse Brother    Alcohol abuse Brother     Social History:  reports that she has been smoking cigarettes. She started smoking about 6 years ago. She has been smoking about 0.05 packs per day. She has never used smokeless tobacco. She reports that she does not drink alcohol and does not use drugs.  Additional Social History:  Alcohol / Drug Use Pain Medications: See PTA  Prescriptions: See PTA Over the Counter: See PTA History of alcohol / drug use?: No history of alcohol / drug abuse Longest period of sobriety (when/how long): n/a  CIWA: CIWA-Ar BP: 137/89 Pulse Rate: 89 COWS:    Allergies:  Allergies  Allergen Reactions   Amoxicillin Other (See Comments)   Penicillins Rash    Home Medications: (Not in a hospital admission)   OB/GYN Status:  No LMP recorded.  (Menstrual status: Irregular Periods).  General Assessment Data Location of Assessment: [facility] ED TTS Assessment: In system Is this a Tele or Face-to-Face Assessment?: Face-to-Face Is this an Initial Assessment or a Re-assessment for this encounter?: Initial Assessment Patient Accompanied by:: N/A Language Other than English: No Living Arrangements: In Group Home: (Comment: Name of Group Home) [facility]) What gender do you identify as?: Female Date Telepsych consult ordered in [facility]: 05/31/20 Time Telepsych consult ordered in [facility]: 1621 Marital status: Single Pregnancy Status: No Living Arrangements: Group Home Can pt return to current living arrangement?: Yes Admission Status: Voluntary Is patient capable of signing voluntary admission?: No (Have Guardian) Insurance type: Medicare A&B  Medical Screening Exam [facility] Walk-in ONLY) Medical Exam completed: Yes  Crisis Care Plan Living Arrangements: Group Home Legal Guardian: Other: ([employer]  ([name]-[phone])) Name of Psychiatrist: Dr. Ave Filter w/[facility]  Name of Therapist: Ranelle Oyster (Private Practice)  Education Status Is patient currently in school?: No Is the patient employed, unemployed or receiving disability?: Unemployed, Receiving disability income  Risk to self with the past 6 months Suicidal Ideation: No Has patient been a risk to self within the past 6 months prior to admission? : No Suicidal Intent: No Has patient had any suicidal intent within the past 6 months prior to admission? : No Is patient at risk for suicide?: No Suicidal Plan?: No Has patient had any suicidal plan within the past 6 months prior to admission? : No Access to Means: No What has been your use of drugs/alcohol within the last 12 months?: Reports of none Previous Attempts/Gestures: No How many times?: 0 Other Self Harm Risks: Reports of none Triggers for Past Attempts: Other  (Comment) Intentional Self Injurious Behavior: None Family Suicide History: Unknown Recent stressful life event(s): Conflict (Comment), Other (Comment) Persecutory voices/beliefs?: No Depression: No Depression Symptoms: Feeling angry/irritable Substance abuse history and/or treatment for substance abuse?: No Suicide prevention information given to non-admitted patients: Not applicable  Risk to Others within the past 6 months Homicidal Ideation: No Does patient have any lifetime risk of violence toward others beyond the six months prior to admission? : No Thoughts of Harm to Others: No Current Homicidal Intent: No Current Homicidal Plan: No Access to Homicidal Means: No Identified Victim: Reports of none History of harm to others?: No Assessment of Violence: None Noted Violent Behavior Description: Reports of none Does patient have access to weapons?: No Criminal Charges Pending?: No Does patient have a court date: No Is patient on probation?: No  Psychosis Hallucinations: None noted Delusions: Unspecified  Mental Status Report Appearance/Hygiene: Unremarkable, In scrubs Eye Contact: Good Motor Activity: Freedom of movement, Unremarkable Speech: Logical/coherent, Unremarkable Level of Consciousness: Alert Mood: Sad, Pleasant Affect: Appropriate to circumstance, Sad Anxiety Level: None Thought Processes: Coherent, Relevant Judgement: Unimpaired Orientation: Person, Place, Time, Situation, Appropriate for developmental age Obsessive Compulsive Thoughts/Behaviors: None  Cognitive Functioning Concentration: Normal Memory: Recent Intact, Remote Intact Is patient IDD: No Insight: Fair Impulse Control: Fair Appetite: Good Have you had any weight changes? : No Change  Sleep: Decreased Total Hours of Sleep: 4 (Trouble falling & Staying asleep) Vegetative Symptoms: None  ADLScreening [facility] Assessment Services) Patient's cognitive ability adequate to safely complete daily  activities?: Yes Patient able to express need for assistance with ADLs?: Yes Independently performs ADLs?: Yes (appropriate for developmental age)  Prior Inpatient Therapy Prior Inpatient Therapy: Yes Prior Therapy Dates: "I think six years ago." (Unable to remember dates) Prior Therapy Facilty/Provider(s): [facility] was in [place] (Patient unable to remember the name) Reason for Treatment: 01/2010 & Unknown  Prior Outpatient Therapy Prior Outpatient Therapy: Yes Prior Therapy Dates: Current Prior Therapy Facilty/Provider(s): Dr. Ave Filter w/[facility]  Reason for Treatment: Bipolar, Mild MR & Schizoaffective Disorder Does patient have an ACCT team?: No Does patient have Intensive In-House Services?  : No Does patient have [facility] services? : No Does patient have P4CC services?: No  ADL Screening (condition at time of admission) Patient's cognitive ability adequate to safely complete daily activities?: Yes Is the patient deaf or have difficulty hearing?: No Does the patient have difficulty seeing, even when wearing glasses/contacts?: No Does the patient have difficulty concentrating, remembering, or making decisions?: No Patient able to express need for assistance with ADLs?: Yes Does the patient have difficulty dressing or bathing?: No Independently performs ADLs?: Yes (appropriate for developmental age) Does the patient have difficulty walking or climbing stairs?: No Weakness of Legs: None Weakness of Arms/Hands: None  Home Assistive Devices/Equipment Home Assistive Devices/Equipment: None  Therapy Consults (therapy consults require a physician order) PT Evaluation Needed: No OT Evalulation Needed: No SLP Evaluation Needed: No Abuse/Neglect Assessment (Assessment to be complete while patient is alone) Abuse/Neglect Assessment Can Be Completed: Yes Physical Abuse: Denies Verbal Abuse: Denies Sexual Abuse: Denies Exploitation of patient/patient's  resources: Denies Self-Neglect: Denies Values / Beliefs Cultural Requests During Hospitalization: None Spiritual Requests During Hospitalization: None Consults Spiritual Care Consult Needed: No Transition of Care Team Consult Needed: No Advance Directives (For Healthcare) Does Patient Have a Medical Advance Directive?: No  Disposition:  Disposition Initial Assessment Completed for this Encounter: Yes  On Site Evaluation by:   Reviewed with Physician:    Lilyan Gilford MS, LCAS, [facility], NCC Therapeutic Triage Specialist 05/31/2020 6:39 PM

## 2020-05-31 NOTE — ED Notes (Signed)
[name] [phone]) patient guardian notified that patient is being discharged back to group home. Verbal permission given for patient to take cab home due to group home unable to provide transportation per Ms. Blackwell group home owner.

## 2020-05-31 NOTE — ED Notes (Signed)
Pt. Alert and oriented, warm and dry, in no distress. Pt. Denies SI, HI, and AVH. Pt. Encouraged to let nursing staff know of any concerns or needs. 

## 2020-05-31 NOTE — ED Notes (Signed)

## 2020-05-31 NOTE — ED Notes (Signed)
CBG 121, Bill,RN made aware.

## 2020-06-15 ENCOUNTER — Emergency Department
Admission: EM | Admit: 2020-06-15 | Discharge: 2020-06-15 | Disposition: A | Discharge status: Home / Self Care | Payer: Medicare Other | Attending: Emergency Medicine | Admitting: Emergency Medicine

## 2020-06-15 ENCOUNTER — Emergency Department: Payer: Medicare Other

## 2020-06-15 ENCOUNTER — Encounter: Payer: Self-pay | Admitting: Emergency Medicine

## 2020-06-15 ENCOUNTER — Other Ambulatory Visit: Payer: Self-pay

## 2020-06-15 DIAGNOSIS — J45909 Unspecified asthma, uncomplicated: Secondary | ICD-10-CM | POA: Insufficient documentation

## 2020-06-15 DIAGNOSIS — R1011 Right upper quadrant pain: Secondary | ICD-10-CM

## 2020-06-15 DIAGNOSIS — R0789 Other chest pain: Secondary | ICD-10-CM | POA: Diagnosis not present

## 2020-06-15 DIAGNOSIS — E119 Type 2 diabetes mellitus without complications: Secondary | ICD-10-CM | POA: Diagnosis not present

## 2020-06-15 DIAGNOSIS — F17210 Nicotine dependence, cigarettes, uncomplicated: Secondary | ICD-10-CM | POA: Insufficient documentation

## 2020-06-15 DIAGNOSIS — Z794 Long term (current) use of insulin: Secondary | ICD-10-CM | POA: Insufficient documentation

## 2020-06-15 DIAGNOSIS — R079 Chest pain, unspecified: Secondary | ICD-10-CM | POA: Diagnosis present

## 2020-06-15 LAB — URINALYSIS, COMPLETE (UACMP) WITH MICROSCOPIC
Bilirubin Urine: NEGATIVE
Glucose, UA: NEGATIVE mg/dL
Hgb urine dipstick: NEGATIVE
Ketones, ur: NEGATIVE mg/dL
Leukocytes,Ua: NEGATIVE
Nitrite: NEGATIVE
Protein, ur: NEGATIVE mg/dL
Specific Gravity, Urine: 1.023 (ref 1.005–1.030)
pH: 5 (ref 5.0–8.0)

## 2020-06-15 LAB — CBC
HCT: 37.8 % (ref 36.0–46.0)
Hemoglobin: 12.9 g/dL (ref 12.0–15.0)
MCH: 28 pg (ref 26.0–34.0)
MCHC: 34.1 g/dL (ref 30.0–36.0)
MCV: 82 fL (ref 80.0–100.0)
Platelets: 240 10*3/uL (ref 150–400)
RBC: 4.61 MIL/uL (ref 3.87–5.11)
RDW: 13.2 % (ref 11.5–15.5)
WBC: 4.9 10*3/uL (ref 4.0–10.5)
nRBC: 0 % (ref 0.0–0.2)

## 2020-06-15 LAB — HEPATIC FUNCTION PANEL
ALT: 15 U/L (ref 0–44)
AST: 19 U/L (ref 15–41)
Albumin: 4.2 g/dL (ref 3.5–5.0)
Alkaline Phosphatase: 43 U/L (ref 38–126)
Bilirubin, Direct: <0.1 mg/dL (ref 0.0–0.2)
Total Bilirubin: 0.6 mg/dL (ref 0.3–1.2)
Total Protein: 8.5 g/dL — ABNORMAL HIGH (ref 6.5–8.1)

## 2020-06-15 LAB — LIPASE, BLOOD: Lipase: 36 U/L (ref 11–51)

## 2020-06-15 LAB — BASIC METABOLIC PANEL
Anion gap: 10 (ref 5–15)
BUN: 12 mg/dL (ref 6–20)
CO2: 22 mmol/L (ref 22–32)
Calcium: 9.3 mg/dL (ref 8.9–10.3)
Chloride: 103 mmol/L (ref 98–111)
Creatinine, Ser: 0.82 mg/dL (ref 0.44–1.00)
GFR calc Af Amer: >60 mL/min (ref >60)
GFR calc non Af Amer: >60 mL/min (ref >60)
Glucose, Bld: 92 mg/dL (ref 70–99)
Potassium: 3.9 mmol/L (ref 3.5–5.1)
Sodium: 135 mmol/L (ref 135–145)

## 2020-06-15 LAB — WET PREP, GENITAL
Clue Cells Wet Prep HPF POC: NONE SEEN
Sperm: NONE SEEN
Trich, Wet Prep: NONE SEEN
Yeast Wet Prep HPF POC: NONE SEEN

## 2020-06-15 LAB — GLUCOSE, CAPILLARY: Glucose-Capillary: 86 mg/dL (ref 70–99)

## 2020-06-15 LAB — CHLAMYDIA/NGC RT PCR (ARMC ONLY)
Chlamydia Tr: NOT DETECTED
N gonorrhoeae: NOT DETECTED

## 2020-06-15 LAB — TROPONIN I (HIGH SENSITIVITY): Troponin I (High Sensitivity): <2 ng/L (ref <18)

## 2020-06-15 MED ORDER — ACETAMINOPHEN 500 MG PO TABS
1000.0000 mg | ORAL_TABLET | Freq: Once | ORAL | Status: AC
  Administered 2020-06-15: 1000 mg via ORAL
  Filled 2020-06-15: qty 2

## 2020-06-15 NOTE — ED Notes (Signed)
ED Provider at bedside. 

## 2020-06-15 NOTE — ED Notes (Signed)
Turkey tray given  

## 2020-06-15 NOTE — Discharge Instructions (Addendum)
Your work-up was reassuring, no evidence of heart attack, pneumonia, gallstones.  Take Tylenol 1 g every 8 hours to help with your pain.  Return to the ER if you develop new or worsening symptoms

## 2020-06-15 NOTE — ED Triage Notes (Signed)
Pt in via POV from [facility], complaints of intermittent chest pain since recently using a Nicotine pain.  Denies radiation, denies any associated symptoms.  Vitals WDL, NAD noted at this time.

## 2020-06-15 NOTE — ED Provider Notes (Signed)
[facility] Emergency Department Provider Note  ____________________________________________   First MD Initiated Contact with Patient 06/15/20 1918     (approximate)  I have reviewed the triage vital signs and the nursing notes.   HISTORY  Chief Complaint Chest Pain    HPI Alexandria Reynolds is a 29 y.o. female with diabetes, bipolar, schizoaffective who comes in for chest pain.  Patient reports pain in the middle of her chest radiating down around her right breast.  The pain is been there since 3 AM, constant, nothing makes it better, nothing makes it worse.  She states that she thinks it started because she took a nicotine patch yesterday.  She is trying to stop smoking.  She denies any shortness of breath, leg swelling, recent long travel, recent surgery, not on estrogen.  She reports vaginal itching and discomfort as well.  Denies sexual contact.  Patient has had her Covid vaccines.            Past Medical History:  Diagnosis Date  . ADHD (attention deficit hyperactivity disorder)   . Bipolar disorder (HCC)   . Diabetes mellitus, type II (HCC)   . Schizoaffective disorder [facility])     Patient Active Problem List   Diagnosis Date Noted  . 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 Developmental disability 08/01/2016  . Bipolar I disorder (HCC) 03/28/2016  . Bipolar affective disorder (HCC) 02/15/2016  . Schizophrenia (HCC) 02/15/2016  . Asthma, mild intermittent 09/19/2013  . Absence of menstruation 07/08/2013  . Type 2 diabetes mellitus (HCC) 07/08/2013  . H/O infectious disease 07/08/2013  . Adiposity 07/08/2013  . Current smoker 07/08/2013    History reviewed. No pertinent surgical history.  Prior to Admission medications   Medication Sig Start Date End Date Taking? Authorizing Provider  chlorproMAZINE (THORAZINE) 50 MG tablet Take 100 mg by mouth at bedtime. Given at 8am and 3pm    [provider]  insulin aspart (NOVOLOG) 100 UNIT/ML FlexPen Inject 12 Units into the skin 3 (three) times daily with meals.  07/02/13   [provider]  insulin detemir (LEVEMIR) 100 UNIT/ML injection Inject 23 Units into the skin at bedtime. Flex pen    [provider]  lamoTRIgine (LAMICTAL) 150 MG tablet Take 200 mg by mouth daily.     [provider]  loratadine (CLARITIN) 10 MG tablet  06/29/14   [provider]  meloxicam (MOBIC) 15 MG tablet Take 1 tablet (15 mg total) by mouth daily. 06/16/16   Cuthriell, Delorise Royals, PA-C  metFORMIN (GLUCOPHAGE) 500 MG tablet Take 500 mg by mouth 2 (two) times daily.    [provider]  metoCLOPramide (REGLAN) 10 MG tablet Take by mouth 2 (two) times daily.     [provider]  solifenacin (VESICARE) 10 MG tablet Take 10 mg by mouth daily.    [provider]  traZODone (DESYREL) 100 MG tablet Take by mouth.    [provider]    Allergies Penicillins  Family History  Problem Relation Age of Onset  . Drug abuse Mother   . Alcohol abuse Mother   . Alcohol abuse Brother   . Drug abuse Brother   . Alcohol abuse Brother     Social History Social History   Tobacco Use  . Smoking status: Current Every Day Smoker    Packs/day: 0.05    Types: Cigarettes    Start date: 02/14/2014  . Smokeless tobacco: Never  Used  Vaping Use  . Vaping Use: Never used  Substance Use Topics  . Alcohol use: No    Alcohol/week: 0.0 standard drinks  . Drug use: No      Review of Systems Constitutional: No fever/chills Eyes: No visual changes. ENT: No sore throat. Cardiovascular: Denies chest pain. Respiratory: Denies shortness of breath. Gastrointestinal: Pain in the chest going into the right upper quadrant no nausea, no vomiting.  No diarrhea.  No constipation. Genitourinary: Negative for dysuria.  Vaginal itching Musculoskeletal: Negative for back pain. Skin: Negative for  rash. Neurological: Negative for headaches, focal weakness or numbness. All other ROS negative ____________________________________________   PHYSICAL EXAM:  VITAL SIGNS: ED Triage Vitals  Enc Vitals Group     BP 06/15/20 1708 (!) 147/98     Pulse Rate 06/15/20 1708 80     Resp 06/15/20 1708 18     Temp 06/15/20 1708 99.7 F (37.6 C)     Temp Source 06/15/20 1708 Oral     SpO2 06/15/20 1708 98 %     Weight 06/15/20 1709 234 lb (106.1 kg)     Height 06/15/20 1709 5\' 6"  (1.676 m)     Head Circumference --      Peak Flow --      Pain Score 06/15/20 1716 6     Pain Loc --      Pain Edu? --      Excl. in GC? --     Constitutional: Alert and oriented. Well appearing and in no acute distress. Eyes: Conjunctivae are normal. EOMI. Head: Atraumatic. Nose: No congestion/rhinnorhea. Mouth/Throat: Mucous membranes are moist.   Neck: No stridor. Trachea Midline. FROM Cardiovascular: Normal rate, regular rhythm. Grossly normal heart sounds.  Good peripheral circulation.  Some chest wall tenderness in the middle Respiratory: Normal respiratory effort.  No retractions. Lungs CTAB. Gastrointestinal: Soft and nontender. No distention. No abdominal bruits.  Musculoskeletal: No lower extremity tenderness nor edema.  No joint effusions. Neurologic:  Normal speech and language. No gross focal neurologic deficits are appreciated.  Skin:  Skin is warm, dry and intact. No rash noted. Psychiatric: Mood and affect are normal. Speech and behavior are normal. GU: Some mild vaginal discharge without erythema of her cervix or significant tenderness  ____________________________________________   LABS (all labs ordered are listed, but only abnormal results are displayed)  Labs Reviewed  WET PREP, GENITAL - Abnormal; Notable for the following components:      Result Value   WBC, Wet Prep HPF POC FEW (*)    All other components within normal limits  HEPATIC FUNCTION PANEL - Abnormal; Notable for  the following components:   Total Protein 8.5 (*)    All other components within normal limits  URINALYSIS, COMPLETE (UACMP) WITH MICROSCOPIC - Abnormal; Notable for the following components:   Color, Urine YELLOW (*)    APPearance CLEAR (*)    Bacteria, UA RARE (*)    All other components within normal limits  CHLAMYDIA/NGC RT PCR ([facility] ONLY)  BASIC METABOLIC PANEL  CBC  GLUCOSE, CAPILLARY  LIPASE, BLOOD  POC URINE PREG, ED  TROPONIN I (HIGH SENSITIVITY)   ____________________________________________   ED ECG REPORT I, 06/17/20, the attending physician, personally viewed and interpreted this ECG.  Normal sinus rate of 76, no ST elevation, T wave inversion in lead III and aVF, normal intervals.  No recent EKG in 10 years ____________________________________________  RADIOLOGY Concha Se, personally viewed and evaluated these images (plain  radiographs) as part of my medical decision making, as well as reviewing the written report by the radiologist.  ED MD interpretation: No pneumonia  Official radiology report(s): DG Chest 2 View  Result Date: 06/15/2020 CLINICAL DATA:  Chest pain EXAM: CHEST - 2 VIEW COMPARISON:  None. FINDINGS: The heart size and mediastinal contours are within normal limits. Both lungs are clear. The visualized skeletal structures are unremarkable. IMPRESSION: No active cardiopulmonary disease. Electronically Signed   By: Jasmine Pang M.D.   On: 06/15/2020 17:50    ____________________________________________   PROCEDURES  Procedure(s) performed (including Critical Care):  Procedures   ____________________________________________   INITIAL IMPRESSION / ASSESSMENT AND PLAN / ED COURSE  Alexandria Reynolds was evaluated in Emergency Department on 06/15/2020 for the symptoms described in the history of present illness. She was evaluated in the context of the global COVID-19 pandemic, which necessitated consideration that the patient  might be at risk for infection with the SARS-CoV-2 virus that causes COVID-19. Institutional protocols and algorithms that pertain to the evaluation of patients at risk for COVID-19 are in a state of rapid change based on information released by regulatory bodies including the CDC and federal and state organizations. These policies and algorithms were followed during the patient's care in the ED.    Patient is a well-appearing 29 year old who comes in for some midsternal chest pain radiating to her right upper abdomen.  The pain seems reproducible on exam.  Will get labs to make sure no evidence of ACS, cholecystitis, pancreatitis, ultrasound evaluate for gallbladder pathology.  Patient does report a little bit of vaginal itching.  Offered her to self swab versus me due to pelvic exam.  She preferred me doing a pelvic exam.  There is little bit of discharge but no significant tenderness.  Will get urine to evaluate for UTI.  Patient denies any sexually active in over a year and denies current concerns for STDs.  She denies any Covid contacts and has been vaccinated.  Patient's chest pain does not sound pleuritic in nature and she does not have any shortness of breath. PERC negative.  Chest x-ray was negative for pneumonia  Cardiac marker was less than 2 and symptoms been going on for greater than 3 hours therefore rules her out for ACS  Wet prep was negative.  Ultrasound negative for gallbladder pathology  Reevaluated patient she is doing well, tolerating p.o.  She initially had a little low-grade temperature but on recheck was 98.6.   Work-up was reassuring patient pain is better with tylenol. No sob. will be discharged home at this time. Tolerating PO   I discussed the provisional nature of ED diagnosis, the treatment so far, the ongoing plan of care, follow up appointments and return precautions with the patient and any family or support people present. They expressed understanding and agreed with  the plan, discharged home.      ____________________________________________   FINAL CLINICAL IMPRESSION(S) / ED DIAGNOSES   Final diagnoses:  RUQ abdominal pain  Atypical chest pain      MEDICATIONS GIVEN DURING THIS VISIT:  Medications  acetaminophen (TYLENOL) tablet 1,000 mg (1,000 mg Oral Given 06/15/20 2002)     ED Discharge Orders    None       Note:  This document was prepared using Dragon voice recognition software and may include unintentional dictation errors.   Concha Se, MD 06/15/20 2116

## 2020-07-05 ENCOUNTER — Other Ambulatory Visit: Payer: Self-pay

## 2020-07-05 ENCOUNTER — Emergency Department
Admission: EM | Admit: 2020-07-05 | Discharge: 2020-07-06 | Disposition: A | Discharge status: Home / Self Care | Payer: Medicare Other | Attending: Emergency Medicine | Admitting: Emergency Medicine

## 2020-07-05 ENCOUNTER — Encounter: Payer: Self-pay | Admitting: Emergency Medicine

## 2020-07-05 DIAGNOSIS — F17210 Nicotine dependence, cigarettes, uncomplicated: Secondary | ICD-10-CM | POA: Insufficient documentation

## 2020-07-05 DIAGNOSIS — F319 Bipolar disorder, unspecified: Secondary | ICD-10-CM | POA: Diagnosis present

## 2020-07-05 DIAGNOSIS — E119 Type 2 diabetes mellitus without complications: Secondary | ICD-10-CM | POA: Insufficient documentation

## 2020-07-05 DIAGNOSIS — F32A Depression, unspecified: Secondary | ICD-10-CM

## 2020-07-05 DIAGNOSIS — Z79899 Other long term (current) drug therapy: Secondary | ICD-10-CM | POA: Insufficient documentation

## 2020-07-05 DIAGNOSIS — F4325 Adjustment disorder with mixed disturbance of emotions and conduct: Secondary | ICD-10-CM | POA: Diagnosis present

## 2020-07-05 DIAGNOSIS — J45909 Unspecified asthma, uncomplicated: Secondary | ICD-10-CM | POA: Insufficient documentation

## 2020-07-05 DIAGNOSIS — Z20822 Contact with and (suspected) exposure to covid-19: Secondary | ICD-10-CM | POA: Insufficient documentation

## 2020-07-05 DIAGNOSIS — F329 Major depressive disorder, single episode, unspecified: Secondary | ICD-10-CM | POA: Insufficient documentation

## 2020-07-05 DIAGNOSIS — Z794 Long term (current) use of insulin: Secondary | ICD-10-CM | POA: Insufficient documentation

## 2020-07-05 DIAGNOSIS — F209 Schizophrenia, unspecified: Secondary | ICD-10-CM | POA: Diagnosis present

## 2020-07-05 LAB — URINE DRUG SCREEN, QUALITATIVE (ARMC ONLY)
Amphetamines, Ur Screen: NOT DETECTED
Barbiturates, Ur Screen: NOT DETECTED
Benzodiazepine, Ur Scrn: NOT DETECTED
Cannabinoid 50 Ng, Ur ~~LOC~~: NOT DETECTED
Cocaine Metabolite,Ur ~~LOC~~: NOT DETECTED
MDMA (Ecstasy)Ur Screen: NOT DETECTED
Methadone Scn, Ur: NOT DETECTED
Opiate, Ur Screen: NOT DETECTED
Phencyclidine (PCP) Ur S: NOT DETECTED
Tricyclic, Ur Screen: POSITIVE — AB

## 2020-07-05 LAB — COMPREHENSIVE METABOLIC PANEL
ALT: 20 U/L (ref 0–44)
AST: 33 U/L (ref 15–41)
Albumin: 4.3 g/dL (ref 3.5–5.0)
Alkaline Phosphatase: 56 U/L (ref 38–126)
Anion gap: 9 (ref 5–15)
BUN: 15 mg/dL (ref 6–20)
CO2: 23 mmol/L (ref 22–32)
Calcium: 9 mg/dL (ref 8.9–10.3)
Chloride: 105 mmol/L (ref 98–111)
Creatinine, Ser: 0.71 mg/dL (ref 0.44–1.00)
GFR calc Af Amer: >60 mL/min (ref >60)
GFR calc non Af Amer: >60 mL/min (ref >60)
Glucose, Bld: 89 mg/dL (ref 70–99)
Potassium: 3.4 mmol/L — ABNORMAL LOW (ref 3.5–5.1)
Sodium: 137 mmol/L (ref 135–145)
Total Bilirubin: 0.6 mg/dL (ref 0.3–1.2)
Total Protein: 9.1 g/dL — ABNORMAL HIGH (ref 6.5–8.1)

## 2020-07-05 LAB — CBC
HCT: 38.2 % (ref 36.0–46.0)
Hemoglobin: 13 g/dL (ref 12.0–15.0)
MCH: 27.8 pg (ref 26.0–34.0)
MCHC: 34 g/dL (ref 30.0–36.0)
MCV: 81.6 fL (ref 80.0–100.0)
Platelets: 254 10*3/uL (ref 150–400)
RBC: 4.68 MIL/uL (ref 3.87–5.11)
RDW: 13.1 % (ref 11.5–15.5)
WBC: 6.1 10*3/uL (ref 4.0–10.5)
nRBC: 0 % (ref 0.0–0.2)

## 2020-07-05 LAB — SARS CORONAVIRUS 2 BY RT PCR (HOSPITAL ORDER, PERFORMED IN ~~LOC~~ HOSPITAL LAB): SARS Coronavirus 2: NEGATIVE

## 2020-07-05 LAB — SALICYLATE LEVEL: Salicylate Lvl: <7 mg/dL — ABNORMAL LOW (ref 7.0–30.0)

## 2020-07-05 LAB — PREGNANCY, URINE: Preg Test, Ur: NEGATIVE

## 2020-07-05 LAB — ACETAMINOPHEN LEVEL: Acetaminophen (Tylenol), Serum: <10 ug/mL — ABNORMAL LOW (ref 10–30)

## 2020-07-05 LAB — ETHANOL: Alcohol, Ethyl (B): <10 mg/dL (ref <10)

## 2020-07-05 LAB — GLUCOSE, CAPILLARY: Glucose-Capillary: 74 mg/dL (ref 70–99)

## 2020-07-05 MED ORDER — METFORMIN HCL 500 MG PO TABS
500.0000 mg | ORAL_TABLET | Freq: Two times a day (BID) | ORAL | Status: DC
  Administered 2020-07-06 (×2): 500 mg via ORAL
  Filled 2020-07-05 (×2): qty 1

## 2020-07-05 MED ORDER — INSULIN DETEMIR 100 UNIT/ML ~~LOC~~ SOLN
23.0000 [IU] | Freq: Every day | SUBCUTANEOUS | Status: DC
  Administered 2020-07-05 – 2020-07-06 (×2): 23 [IU] via SUBCUTANEOUS
  Filled 2020-07-05 (×2): qty 0.23

## 2020-07-05 MED ORDER — DARIFENACIN HYDROBROMIDE ER 7.5 MG PO TB24
7.5000 mg | ORAL_TABLET | Freq: Every day | ORAL | Status: DC
  Administered 2020-07-06: 7.5 mg via ORAL
  Filled 2020-07-05 (×2): qty 1

## 2020-07-05 MED ORDER — INSULIN ASPART 100 UNIT/ML ~~LOC~~ SOLN
12.0000 [IU] | Freq: Three times a day (TID) | SUBCUTANEOUS | Status: DC
  Administered 2020-07-06 (×3): 12 [IU] via SUBCUTANEOUS
  Filled 2020-07-05 (×3): qty 1

## 2020-07-05 MED ORDER — CHLORPROMAZINE HCL 100 MG PO TABS
100.0000 mg | ORAL_TABLET | Freq: Every day | ORAL | Status: DC
  Administered 2020-07-05 – 2020-07-06 (×2): 100 mg via ORAL
  Filled 2020-07-05 (×2): qty 1

## 2020-07-05 MED ORDER — TRAZODONE HCL 100 MG PO TABS
100.0000 mg | ORAL_TABLET | Freq: Every day | ORAL | Status: DC
  Administered 2020-07-05 – 2020-07-06 (×2): 100 mg via ORAL
  Filled 2020-07-05 (×2): qty 1

## 2020-07-05 MED ORDER — LORATADINE 10 MG PO TABS
10.0000 mg | ORAL_TABLET | Freq: Every day | ORAL | Status: DC
  Administered 2020-07-06: 10 mg via ORAL
  Filled 2020-07-05: qty 1

## 2020-07-05 MED ORDER — METOCLOPRAMIDE HCL 10 MG PO TABS
5.0000 mg | ORAL_TABLET | Freq: Two times a day (BID) | ORAL | Status: DC
  Administered 2020-07-05 – 2020-07-06 (×3): 5 mg via ORAL
  Filled 2020-07-05 (×3): qty 1

## 2020-07-05 MED ORDER — LAMOTRIGINE 100 MG PO TABS
200.0000 mg | ORAL_TABLET | Freq: Every day | ORAL | Status: DC
  Administered 2020-07-06: 200 mg via ORAL
  Filled 2020-07-05: qty 2

## 2020-07-05 NOTE — ED Provider Notes (Signed)
[facility] Emergency Department Provider Note   ____________________________________________   First MD Initiated Contact with Patient 07/05/20 1627     (approximate)  I have reviewed the triage vital signs and the nursing notes.   HISTORY  Chief Complaint Psychiatric Evaluation    HPI Alexandria Reynolds is a 29 y.o. female history of bipolar schizophrenia  Patient reports that last 4 months or so she has had increased symptoms of "depression" and low mood.  She denies any desire to hurt her self or anyone else.  She does report that has been having low mood, at times will get angered easily.  She denies any desire to hurt anyone.  She does reside in a group home.  She denies to me any suicidal thoughts, but triage had noticed passive suicidal ideas, but she is currently denying any suicidal thoughts to me.  She does occasionally hear voices that will say things to her but she does not take any action on them they are not telling her to hurt anyone  Longstanding history of mental illness.  No recent illnesses such as fevers or chills.  She would like something to eat such as ice cream and graham crackers   Past Medical History:  Diagnosis Date  . ADHD (attention deficit hyperactivity disorder)   . Bipolar disorder (HCC)   . Diabetes mellitus, type II (HCC)   . Schizoaffective disorder [facility])     Patient Active Problem List   Diagnosis Date Noted  . 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 Developmental disability 08/01/2016  . Bipolar I disorder (HCC) 03/28/2016  . Bipolar affective disorder (HCC) 02/15/2016  . Schizophrenia (HCC) 02/15/2016  . Asthma, mild intermittent 09/19/2013  . Absence of menstruation 07/08/2013  . Type 2 diabetes mellitus (HCC) 07/08/2013  . H/O infectious disease 07/08/2013  . Adiposity 07/08/2013  . Current smoker 07/08/2013    History reviewed. No pertinent surgical  history.  Prior to Admission medications   Medication Sig Start Date End Date Taking? Authorizing Provider  chlorproMAZINE (THORAZINE) 50 MG tablet Take 100 mg by mouth at bedtime. Given at 8am and 3pm    [provider]  insulin aspart (NOVOLOG) 100 UNIT/ML FlexPen Inject 12 Units into the skin 3 (three) times daily with meals.  07/02/13   [provider]  insulin detemir (LEVEMIR) 100 UNIT/ML injection Inject 23 Units into the skin at bedtime. Flex pen    [provider]  lamoTRIgine (LAMICTAL) 150 MG tablet Take 200 mg by mouth daily.     [provider]  loratadine (CLARITIN) 10 MG tablet  06/29/14   [provider]  meloxicam (MOBIC) 15 MG tablet Take 1 tablet (15 mg total) by mouth daily. 06/16/16   Cuthriell, Delorise Royals, PA-C  metFORMIN (GLUCOPHAGE) 500 MG tablet Take 500 mg by mouth 2 (two) times daily.    [provider]  metoCLOPramide (REGLAN) 10 MG tablet Take by mouth 2 (two) times daily.     [provider]  solifenacin (VESICARE) 10 MG tablet Take 10 mg by mouth daily.    [provider]  traZODone (DESYREL) 100 MG tablet Take by mouth.    [provider]    Allergies Penicillins  Family History  Problem Relation Age of Onset  . Drug abuse Mother   . Alcohol abuse Mother   . Alcohol abuse Brother   . Drug abuse Brother   . Alcohol abuse Brother  Social History Social History   Tobacco Use  . Smoking status: Current Every Day Smoker    Packs/day: 0.05    Types: Cigarettes    Start date: 02/14/2014  . Smokeless tobacco: Never Used  Vaping Use  . Vaping Use: Never used  Substance Use Topics  . Alcohol use: No    Alcohol/week: 0.0 standard drinks  . Drug use: No    Review of Systems Constitutional: No fever/chills Eyes: No visual changes.   Cardiovascular: Denies chest pain. Respiratory: Denies shortness of breath. Gastrointestinal: No abdominal pain.   Genitourinary: Negative  for dysuria.  Denies pregnancy, she is a lesbian only. Musculoskeletal: Negative for back pain. Skin: Negative for rash. Neurological: Negative for headaches.    ____________________________________________   PHYSICAL EXAM:  VITAL SIGNS:   Vitals:   07/05/20 1653  BP: (!) 143/93  Pulse: 87  Resp: 19  Temp: 98.5 F (36.9 C)  SpO2: 100%      ED Triage Vitals [07/05/20 1546]  Enc Vitals Group     BP      Pulse      Resp      Temp      Temp src      SpO2      Weight 233 lb 15.9 oz (106.1 kg)     Height 5\' 6"  (1.676 m)     Head Circumference      Peak Flow      Pain Score 0     Pain Loc      Pain Edu?      Excl. in GC?     Constitutional: Alert and oriented. Well appearing and in no acute distress.  She is somewhat low in mood. Eyes: Conjunctivae are normal. Head: Atraumatic. Nose: No congestion/rhinnorhea. Mouth/Throat: Mucous membranes are moist. Neck: No stridor.  Cardiovascular: Normal rate, regular rhythm. Grossly normal heart sounds.  Good peripheral circulation. Respiratory: Normal respiratory effort.  No retractions. Lungs CTAB. Gastrointestinal: Soft and nontender. No distention. Musculoskeletal: No lower extremity tenderness nor edema. Neurologic:  Normal speech and language. No gross focal neurologic deficits are appreciated.  Skin:  Skin is warm, dry and intact. No rash noted. Psychiatric: Mood and affect are somewhat low.  She maintains focus well during our discussion.  She denies desire to hurt herself or anyone else.  Specifically denies suicidal ideation.  Does not appear to be responding to external stimuli  ____________________________________________   LABS (all labs ordered are listed, but only abnormal results are displayed)  Labs Reviewed  COMPREHENSIVE METABOLIC PANEL - Abnormal; Notable for the following components:      Result Value   Potassium 3.4 (*)    Total Protein 9.1 (*)    All other components within normal limits   SALICYLATE LEVEL - Abnormal; Notable for the following components:   Salicylate Lvl <7.0 (*)    All other components within normal limits  ACETAMINOPHEN LEVEL - Abnormal; Notable for the following components:   Acetaminophen (Tylenol), Serum <10 (*)    All other components within normal limits  URINE DRUG SCREEN, QUALITATIVE ([facility] ONLY) - Abnormal; Notable for the following components:   Tricyclic, Ur Screen POSITIVE (*)    All other components within normal limits  SARS CORONAVIRUS 2 BY RT PCR (HOSPITAL ORDER, PERFORMED IN [facility])  GLUCOSE, CAPILLARY  ETHANOL  CBC  PREGNANCY, URINE   ____________________________________________  EKG   ____________________________________________  RADIOLOGY   ____________________________________________   PROCEDURES  Procedure(s) performed: None  Procedures  Critical Care performed: No  ____________________________________________   INITIAL IMPRESSION / ASSESSMENT AND PLAN / ED COURSE  Pertinent labs & imaging results that were available during my care of the patient were reviewed by me and considered in my medical decision making (see chart for details).      Patient here for evaluation of low mood depressive-like symptoms.  Comes voluntarily and she denies suicidal thoughts or plan.  Patient reports symptoms seem consistent with depression and possible bipolar disorder.  History of mental health issues in the past.  Appears appropriate for voluntary status that she denies desire to harm herself or anyone else specifically denies suicidal thoughts at this time  Place consult to psychiatry.  Patiently medically cleared for further psychiatric evaluation at 5:30 PM    ----------------------------------------- 9:22 PM on 07/05/2020 -----------------------------------------  Discussed case with psychiatrist Dr. Smith Robert.  Patient being admitted to psychiatry  ____________________________________________   FINAL  CLINICAL IMPRESSION(S) / ED DIAGNOSES  Final diagnoses:  Depression, unspecified depression type        Note:  This document was prepared using Dragon voice recognition software and may include unintentional dictation errors       Sharyn Creamer, MD 07/05/20 2123

## 2020-07-05 NOTE — ED Notes (Signed)
Pt with tts in interview room

## 2020-07-05 NOTE — ED Notes (Signed)
Pt dressed in blue paper scrubs by this tech and Brittney, NT. Pt items placed in belongings bag are following:  One black sneaker and one white sneaker Pink bra Dentist Airpods Black pants Blue and black t-shirt White ring in cup Pack of cigarettes  Pt has a piece of paper with phone numbers she needs.

## 2020-07-05 NOTE — ED Notes (Signed)
Report off to kim rn  
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
## 2020-07-05 NOTE — ED Notes (Signed)
Pt eating

## 2020-07-05 NOTE — ED Notes (Signed)
Pt is from a group home.  Pt states she has been disrespectful to staff, cursing them out, trashing her room.  Denies SI or HI.  Pt denies drugs or etoh use.   Pt states her sister is not coming to visit her and that is upsetting her too.  Pt calm and cooperative.  Pt in hallway bed.

## 2020-07-05 NOTE — ED Notes (Signed)
Pt refused to take hairtie out of her hair.

## 2020-07-05 NOTE — ED Notes (Addendum)
psychiatrist talking with pt in hallway bed.

## 2020-07-05 NOTE — ED Triage Notes (Signed)
Says she's been going through a lot in last 4 months.  Feels like no one cares about her and has been suicidal at times, but not now.  Says she needs help.

## 2020-07-05 NOTE — ED Notes (Signed)

## 2020-07-06 ENCOUNTER — Other Ambulatory Visit: Payer: Self-pay

## 2020-07-06 ENCOUNTER — Inpatient Hospital Stay
Admission: AD | Admit: 2020-07-06 | Discharge: 2020-07-09 | DRG: 882 | Disposition: A | Discharge status: Home / Self Care | Payer: Medicare Other | Source: Intra-hospital | Attending: Psychiatry | Admitting: Psychiatry

## 2020-07-06 DIAGNOSIS — Z794 Long term (current) use of insulin: Secondary | ICD-10-CM

## 2020-07-06 DIAGNOSIS — Z79899 Other long term (current) drug therapy: Secondary | ICD-10-CM

## 2020-07-06 DIAGNOSIS — Z813 Family history of other psychoactive substance abuse and dependence: Secondary | ICD-10-CM | POA: Diagnosis not present

## 2020-07-06 DIAGNOSIS — F259 Schizoaffective disorder, unspecified: Secondary | ICD-10-CM | POA: Diagnosis present

## 2020-07-06 DIAGNOSIS — E119 Type 2 diabetes mellitus without complications: Secondary | ICD-10-CM | POA: Diagnosis present

## 2020-07-06 DIAGNOSIS — F70 Mild intellectual disabilities: Secondary | ICD-10-CM | POA: Diagnosis present

## 2020-07-06 DIAGNOSIS — F4325 Adjustment disorder with mixed disturbance of emotions and conduct: Secondary | ICD-10-CM | POA: Diagnosis present

## 2020-07-06 DIAGNOSIS — F909 Attention-deficit hyperactivity disorder, unspecified type: Secondary | ICD-10-CM | POA: Diagnosis present

## 2020-07-06 DIAGNOSIS — F319 Bipolar disorder, unspecified: Secondary | ICD-10-CM | POA: Diagnosis present

## 2020-07-06 DIAGNOSIS — Z811 Family history of alcohol abuse and dependence: Secondary | ICD-10-CM | POA: Diagnosis not present

## 2020-07-06 DIAGNOSIS — F89 Unspecified disorder of psychological development: Secondary | ICD-10-CM | POA: Diagnosis present

## 2020-07-06 DIAGNOSIS — F17210 Nicotine dependence, cigarettes, uncomplicated: Secondary | ICD-10-CM | POA: Diagnosis present

## 2020-07-06 LAB — GLUCOSE, CAPILLARY
Glucose-Capillary: 138 mg/dL — ABNORMAL HIGH (ref 70–99)
Glucose-Capillary: 133 mg/dL — ABNORMAL HIGH (ref 70–99)
Glucose-Capillary: 107 mg/dL — ABNORMAL HIGH (ref 70–99)

## 2020-07-06 MED ORDER — INSULIN ASPART 100 UNIT/ML ~~LOC~~ SOLN
12.0000 [IU] | Freq: Three times a day (TID) | SUBCUTANEOUS | Status: DC
  Administered 2020-07-07 – 2020-07-09 (×9): 12 [IU] via SUBCUTANEOUS
  Filled 2020-07-06 (×7): qty 1

## 2020-07-06 MED ORDER — METOCLOPRAMIDE HCL 5 MG PO TABS
5.0000 mg | ORAL_TABLET | Freq: Two times a day (BID) | ORAL | Status: DC
  Administered 2020-07-07 – 2020-07-09 (×6): 5 mg via ORAL
  Filled 2020-07-06 (×8): qty 1

## 2020-07-06 MED ORDER — LORAZEPAM 1 MG PO TABS: 1.0000 mg | ORAL_TABLET | Freq: Every day | ORAL | Status: DC | PRN

## 2020-07-06 MED ORDER — CHLORPROMAZINE HCL 100 MG PO TABS
100.0000 mg | ORAL_TABLET | Freq: Every day | ORAL | Status: DC
  Administered 2020-07-07 – 2020-07-08 (×2): 100 mg via ORAL
  Filled 2020-07-06 (×2): qty 1

## 2020-07-06 MED ORDER — DARIFENACIN HYDROBROMIDE ER 7.5 MG PO TB24
7.5000 mg | ORAL_TABLET | Freq: Every day | ORAL | Status: DC
  Administered 2020-07-07 – 2020-07-09 (×3): 7.5 mg via ORAL
  Filled 2020-07-06 (×5): qty 1

## 2020-07-06 MED ORDER — TRAZODONE HCL 100 MG PO TABS
100.0000 mg | ORAL_TABLET | Freq: Every day | ORAL | Status: DC
  Administered 2020-07-07 – 2020-07-08 (×2): 100 mg via ORAL
  Filled 2020-07-06 (×2): qty 1

## 2020-07-06 MED ORDER — LURASIDONE HCL 40 MG PO TABS: 60.0000 mg | ORAL_TABLET | Freq: Every day | ORAL | Status: DC

## 2020-07-06 MED ORDER — INSULIN DETEMIR 100 UNIT/ML ~~LOC~~ SOLN
23.0000 [IU] | Freq: Every day | SUBCUTANEOUS | Status: DC
  Administered 2020-07-07 – 2020-07-08 (×2): 23 [IU] via SUBCUTANEOUS
  Filled 2020-07-06 (×3): qty 0.23

## 2020-07-06 MED ORDER — LORATADINE 10 MG PO TABS
10.0000 mg | ORAL_TABLET | Freq: Every day | ORAL | Status: DC
  Administered 2020-07-07 – 2020-07-09 (×3): 10 mg via ORAL
  Filled 2020-07-06 (×3): qty 1

## 2020-07-06 MED ORDER — METFORMIN HCL 500 MG PO TABS
500.0000 mg | ORAL_TABLET | Freq: Two times a day (BID) | ORAL | Status: DC
  Administered 2020-07-07 – 2020-07-09 (×6): 500 mg via ORAL
  Filled 2020-07-06 (×6): qty 1

## 2020-07-06 MED ORDER — LAMOTRIGINE 100 MG PO TABS
200.0000 mg | ORAL_TABLET | Freq: Every day | ORAL | Status: DC
  Administered 2020-07-07 – 2020-07-09 (×3): 200 mg via ORAL
  Filled 2020-07-06 (×3): qty 2

## 2020-07-06 NOTE — ED Notes (Signed)
Patient ate 100% of lunch and beverage.  

## 2020-07-06 NOTE — ED Notes (Signed)
Pt brushed her teeth again. Tooth brush and paste given back to this RN at nurses station.

## 2020-07-06 NOTE — ED Notes (Signed)
Offered pt a shower, however pt states she "is not ready to shower right now" and will inform this tech when she is ready.

## 2020-07-06 NOTE — ED Notes (Signed)
Pt updated. Pt calm and cooperative. Pt sitting in bed.

## 2020-07-06 NOTE — BH Assessment (Signed)
Referral information for Psychiatric Hospitalization faxed to:   [facility] ([phone]- [phone]),    [address] [phone]),    [facility] [phone] -or- [phone]),    [facility] [phone]- [phone])  [phone]   [facility] [phone] or [phone])    [facility] [phone] or [phone]),

## 2020-07-06 NOTE — ED Notes (Signed)
Called TTS at [phone] in order to determine if pt is going to behavioral tonight or tomorrow. No answer from TTS. Will try to call again later.

## 2020-07-06 NOTE — ED Notes (Signed)
Patient ask for prune juice, states that she is constipated, CNA obtained juice for her.

## 2020-07-06 NOTE — ED Notes (Signed)
Given snack per request 

## 2020-07-06 NOTE — ED Notes (Signed)
Pt asking to shower at this time. Pt was given supplies and is able to shower independently.

## 2020-07-06 NOTE — ED Notes (Signed)
Pt given hospital phone as requested to make short call.

## 2020-07-06 NOTE — ED Notes (Signed)
Melanie NT to complete BG check soon.

## 2020-07-06 NOTE — ED Notes (Signed)
Pt ate 100% of dinner tray

## 2020-07-06 NOTE — ED Notes (Signed)
Pt given breakfast tray

## 2020-07-06 NOTE — ED Notes (Signed)
TTS called this RN to notify orders should be placed soon for pt to move downstairs. Stated to call report at 2200. Will update pt.

## 2020-07-06 NOTE — BH Assessment (Signed)
Patient can come down after 8pm  Call to give report: [phone]  Patient is to be admitted to [facility] by Dr. Toni Amend.  Attending Physician will be. Dr. Toni Amend.   Patient has been assigned to room (given at arrival), by [facility] Charge Nurse Joy, RN.   Intake Paper Work has been signed and placed on patient chart.  ER staff is aware of the admission: 1. Misty Stanley, ER Secretary  2. Roxan Hockey, ER MD  3. Toniann Fail Patient's Nurse  4. [facility]O Patient Access.

## 2020-07-06 NOTE — BH Assessment (Addendum)
Writer spoke with patient's legal guardian [name] [phone] who verbally consents to patient having Inpatient treatment.

## 2020-07-06 NOTE — ED Notes (Signed)
Pt alert and sitting calmly in bed. Pt asked Melanie NT for some juice. Juice given to pt. Pt now talking with security staff.

## 2020-07-06 NOTE — ED Notes (Signed)
Will provide pt with her metformin and 12 units of insulin once dinner trays arrive to ED.

## 2020-07-06 NOTE — ED Notes (Signed)
Pt accompanied to restroom down the hallway as [employer] out-of-order currently. Pt had episode of diarrhea after drinking prune juice. Given bath wipes. Given new set of scrubs as requested. Pt changed and this RN threw out old scrubs. Pt provided self with peri care.

## 2020-07-06 NOTE — ED Notes (Signed)
Pt ate all of breakfast.  
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
## 2020-07-06 NOTE — ED Notes (Signed)
Food tray was given with juice. 

## 2020-07-07 ENCOUNTER — Encounter: Payer: Self-pay | Admitting: Psychiatry

## 2020-07-07 DIAGNOSIS — F4325 Adjustment disorder with mixed disturbance of emotions and conduct: Principal | ICD-10-CM

## 2020-07-07 LAB — GLUCOSE, CAPILLARY
Glucose-Capillary: 141 mg/dL — ABNORMAL HIGH (ref 70–99)
Glucose-Capillary: 123 mg/dL — ABNORMAL HIGH (ref 70–99)
Glucose-Capillary: 111 mg/dL — ABNORMAL HIGH (ref 70–99)

## 2020-07-07 MED ORDER — LURASIDONE HCL 40 MG PO TABS
80.0000 mg | ORAL_TABLET | Freq: Every day | ORAL | Status: DC
  Administered 2020-07-07 – 2020-07-08 (×2): 80 mg via ORAL
  Filled 2020-07-07 (×2): qty 2

## 2020-07-07 NOTE — Progress Notes (Signed)
Recreation Therapy Notes    Date: 07/07/2020  Time: 9:30 am   Location: [facility]     Behavioral response: N/A   Intervention Topic: Strengths   Discussion/Intervention: Patient did not attend group.   Clinical Observations/Feedback:  Patient did not attend group.   Brannan Cassedy LRT/CTRS        Stevi Hollinshead 07/07/2020 12:33 PM

## 2020-07-07 NOTE — BHH Counselor (Signed)
CSW received called from [facility] at [facility].  CSW explained that at this time, CSW is unable to provide any information until signed consents have been returned.  Penni Homans, MSW, LCSW 07/07/2020 1:48 PM
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
## 2020-07-07 NOTE — Progress Notes (Signed)
Admission Note:  29 yr Female who presents Voluntary Committment  no acute distress for the treatment of SI and Depression. Patient appears flat and depressed, she was receptive to staff, calm and cooperative with admission process, she denies  SI/HI/AVH and contracts for safety upon admission. Patient experienced worsening depression , she stated she lives in a group home she felt very sad   Patient has Past medical Hx of Asthma, Schizophrenia, Bipolar 1 disorder, developmental disability, Adiposity, and DM without complication .patient skin was assessed and found to be clear of any abnormal marks, skin warm and dry she was searched and no contraband found, POC and unit policies explained, understanding verbalized, and onsents obtained.15 minutes safety checks maintained will continue to monitor

## 2020-07-07 NOTE — Plan of Care (Signed)
D- Patient alert and oriented. Patient presented in a pleasant mood on assessment stating that she slept "really good" last night and had no complaints to voice to this Clinical research associate. Patient endorsed both depression and anxiety, reporting "missing my family and being in a group home", is why she feels this way. Patient denied SI, HI, AVH, and pain at this time. Patient's goal for today is "to get my meds adjusted and to get back on track with my meds", in which she will "see if the doctor can change my meds", in order to accomplish her goal.  A- Scheduled medications administered to patient, per MD orders. Support and encouragement provided.  Routine safety checks conducted every 15 minutes.  Patient informed to notify staff with problems or concerns.  R- No adverse drug reactions noted. Patient contracts for safety at this time. Patient compliant with medications and treatment plan. Patient receptive, calm, and cooperative. Patient interacts well with others on the unit.  Patient remains safe at this time.  Problem: Education: Goal: Knowledge of [place] [facility] Education information/materials will improve Outcome: Progressing Goal: Emotional status will improve Outcome: Progressing Goal: Mental status will improve Outcome: Progressing Goal: Verbalization of understanding the information provided will improve Outcome: Progressing   Problem: Activity: Goal: Interest or engagement in activities will improve Outcome: Progressing Goal: Sleeping patterns will improve Outcome: Progressing   Problem: Coping: Goal: Ability to verbalize frustrations and anger appropriately will improve Outcome: Progressing Goal: Ability to demonstrate self-control will improve Outcome: Progressing   Problem: Health Behavior/Discharge Planning: Goal: Identification of resources available to assist in meeting health care needs will improve Outcome: Progressing Goal: Compliance with treatment plan for underlying  cause of condition will improve Outcome: Progressing   Problem: Physical Regulation: Goal: Ability to maintain clinical measurements within normal limits will improve Outcome: Progressing   Problem: Safety: Goal: Periods of time without injury will increase Outcome: Progressing   Problem: Education: Goal: Ability to make informed decisions regarding treatment will improve Outcome: Progressing   Problem: Coping: Goal: Coping ability will improve Outcome: Progressing   Problem: Health Behavior/Discharge Planning: Goal: Identification of resources available to assist in meeting health care needs will improve Outcome: Progressing   Problem: Medication: Goal: Compliance with prescribed medication regimen will improve Outcome: Progressing   Problem: Self-Concept: Goal: Ability to disclose and discuss suicidal ideas will improve Outcome: Progressing Goal: Will verbalize positive feelings about self Outcome: Progressing

## 2020-07-07 NOTE — BHH Suicide Risk Assessment (Signed)
[facility] Admission Suicide Risk Assessment   Nursing information obtained from:  Patient Demographic factors:  Adolescent or young adult Current Mental Status:  NA Loss Factors:  NA Historical Factors:  Impulsivity Risk Reduction Factors:  NA, Living with another person, especially a relative  Total Time spent with patient: 1 hour Principal Problem: Adjustment disorder with mixed disturbance of emotions and conduct Diagnosis:  Principal Problem:   Adjustment disorder with mixed disturbance of emotions and conduct Active Problems:   Bipolar affective disorder (HCC)   Developmental disability  Subjective Data: Evaluation for this 29 year old woman with a history of chronic behavior problems.  Comes to [place] from her group home after tearing up her bedroom in an episode of frustration.  Patient denies any suicidal thoughts intent or plan at all.  Denies homicidal ideation.  Denies current psychotic symptoms.  Expresses a desire to be cooperative with treatment  Continued Clinical Symptoms:  Alcohol Use Disorder Identification Test Final Score (AUDIT): 0 The "Alcohol Use Disorders Identification Test", Guidelines for Use in Primary Care, Second Edition.  World Science writer [facility]). Score between 0-7:  no or low risk or alcohol related problems. Score between 8-15:  moderate risk of alcohol related problems. Score between 16-19:  high risk of alcohol related problems. Score 20 or above:  warrants further diagnostic evaluation for alcohol dependence and treatment.   CLINICAL FACTORS:   Severe Anxiety and/or Agitation Bipolar Disorder:   Mixed State   Musculoskeletal: Strength & Muscle Tone: within normal limits Gait & Station: normal Patient leans: N/A  Psychiatric Specialty Exam: Physical Exam Vitals and nursing note reviewed.  Constitutional:      Appearance: She is well-developed.  HENT:     Head: Normocephalic and atraumatic.  Eyes:     Conjunctiva/sclera: Conjunctivae  normal.     Pupils: Pupils are equal, round, and reactive to light.  Cardiovascular:     Heart sounds: Normal heart sounds.  Pulmonary:     Effort: Pulmonary effort is normal.  Abdominal:     Palpations: Abdomen is soft.  Musculoskeletal:        General: Normal range of motion.     Cervical back: Normal range of motion.  Skin:    General: Skin is warm and dry.  Neurological:     General: No focal deficit present.     Mental Status: She is alert.  Psychiatric:        Mood and Affect: Mood normal.        Behavior: Behavior normal.        Thought Content: Thought content normal.        Judgment: Judgment normal.     Review of Systems  Constitutional: Negative.   HENT: Negative.   Eyes: Negative.   Respiratory: Negative.   Cardiovascular: Negative.   Gastrointestinal: Negative.   Musculoskeletal: Negative.   Skin: Negative.   Neurological: Negative.   Psychiatric/Behavioral: Negative.     Blood pressure 140/89, pulse 77, temperature 98.3 F (36.8 C), temperature source Oral, resp. rate 17, height 5\' 6"  (1.676 m), weight 106.1 kg, last menstrual period 07/02/2020, SpO2 98 %.Body mass index is 37.77 kg/m.  General Appearance: Casual  Eye Contact:  Good  Speech:  Clear and Coherent  Volume:  Increased  Mood:  Anxious  Affect:  Congruent  Thought Process:  Disorganized  Orientation:  Full (Time, Place, and Person)  Thought Content:  Rumination and Tangential  Suicidal Thoughts:  No  Homicidal Thoughts:  No  Memory:  Immediate;  Fair Recent;   Fair Remote;   Fair  Judgement:  Impaired  Insight:  Shallow  Psychomotor Activity:  Normal  Concentration:  Concentration: Fair  Recall:  Fair  Fund of Knowledge:  Poor  Language:  Fair  Akathisia:  No  Handed:  Right  AIMS (if indicated):     Assets:  Desire for Improvement Housing Resilience Social Support  ADL's:  Intact  Cognition:  Impaired,  Mild  Sleep:  Number of Hours: 6.15      COGNITIVE FEATURES THAT  CONTRIBUTE TO RISK:  Thought constriction (tunnel vision)    SUICIDE RISK:   Minimal: No identifiable suicidal ideation.  Patients presenting with no risk factors but with morbid ruminations; may be classified as minimal risk based on the severity of the depressive symptoms  PLAN OF CARE: Continue 15-minute checks.  Continue outpatient medicine.  Increased dose of Latuda.  Engage in individual and group therapy and assessment.  Reassess for possible discharge within the next day  I certify that inpatient services furnished can reasonably be expected to improve the patient's condition.   Mordecai Rasmussen, MD 07/07/2020, 3:23 PM

## 2020-07-07 NOTE — BHH Suicide Risk Assessment (Signed)
BHH INPATIENT:  Family/Significant Other Suicide Prevention Education  Suicide Prevention Education:  Education Completed; legal guardian Alexandria Reynolds [phone] has been identified by the patient as the family member/significant other with whom the patient will be residing, and identified as the person(s) who will aid the patient in the event of a mental health crisis (suicidal ideations/suicide attempt).  With written consent from the patient, the family member/significant other has been provided the following suicide prevention education, prior to the and/or following the discharge of the patient.  The suicide prevention education provided includes the following:  Suicide risk factors  Suicide prevention and interventions  National Suicide Hotline telephone number  [facility] assessment telephone number  [facility] Emergency Assistance 911  [facility] and/or Residential Mobile Crisis Unit telephone number  Request made of family/significant other to:  Remove weapons (e.g., guns, rifles, knives), all items previously/currently identified as safety concern.    Remove drugs/medications (over-the-counter, prescriptions, illicit drugs), all items previously/currently identified as a safety concern.  The family member/significant other verbalizes understanding of the suicide prevention education information provided.  The family member/significant other agrees to remove the items of safety concern listed above.  Alexandria Reynolds 07/07/2020, 10:08 AM

## 2020-07-07 NOTE — Plan of Care (Signed)
  Problem: Education: Goal: Knowledge of [facility] Education information/materials will improve Outcome: Not Progressing Goal: Emotional status will improve Outcome: Not Progressing Goal: Mental status will improve Outcome: Not Progressing Goal: Verbalization of understanding the information provided will improve Outcome: Not Progressing   Problem: Activity: Goal: Interest or engagement in activities will improve Outcome: Not Progressing Goal: Sleeping patterns will improve Outcome: Not Progressing   Problem: Coping: Goal: Ability to verbalize frustrations and anger appropriately will improve Outcome: Not Progressing Goal: Ability to demonstrate self-control will improve Outcome: Not Progressing   Problem: Health Behavior/Discharge Planning: Goal: Identification of resources available to assist in meeting health care needs will improve Outcome: Not Progressing Goal: Compliance with treatment plan for underlying cause of condition will improve Outcome: Not Progressing   Problem: Physical Regulation: Goal: Ability to maintain clinical measurements within normal limits will improve Outcome: Not Progressing   Problem: Safety: Goal: Periods of time without injury will increase Outcome: Not Progressing   Problem: Education: Goal: Ability to make informed decisions regarding treatment will improve Outcome: Not Progressing   Problem: Coping: Goal: Coping ability will improve Outcome: Not Progressing   Problem: Health Behavior/Discharge Planning: Goal: Identification of resources available to assist in meeting health care needs will improve Outcome: Not Progressing   Problem: Medication: Goal: Compliance with prescribed medication regimen will improve Outcome: Not Progressing   Problem: Self-Concept: Goal: Ability to disclose and discuss suicidal ideas will improve Outcome: Not Progressing Goal: Will verbalize positive feelings about self Outcome: Not  Progressing   

## 2020-07-07 NOTE — H&P (Signed)
Psychiatric Admission Assessment Adult  Patient Identification: Alexandria Reynolds MRN:  [id number] Date of Evaluation:  07/07/2020 Chief Complaint:  bipolar Principal Diagnosis: Adjustment disorder with mixed disturbance of emotions and conduct Diagnosis:  Principal Problem:   Adjustment disorder with mixed disturbance of emotions and conduct Active Problems:   Bipolar affective disorder (HCC)   Developmental disability  History of Present Illness: Patient seen chart reviewed.  29 year old woman with longstanding disability from mental health issues.  She came to the emergency room voluntarily, of which she is very proud, saying that she tore up her bedroom at home and feels like her medicines need to be readdressed.  She is denying any suicidal or homicidal thought.  She is denying any acute psychotic symptoms.  She says that she is compliant with her medicines at home although she does not know any of the medicines that she takes regularly.  Patient is unable to specify any particular factor that gets on her nerves although she says she is easily "triggered" by people asking her questions or staring at her.  Patient tells me that it is her understanding that she has "schizophrenia, bipolar, ADHD, depression".  During the interview with me her chief concern was that we adjust her diet to a regular diet because she insists that she can manage her blood sugars on her own.  Patient has regular outpatient treatment through [facility]. Associated Signs/Symptoms: Depression Symptoms:  psychomotor agitation, difficulty concentrating, anxiety, (Hypo) Manic Symptoms:  Distractibility, Impulsivity, Irritable Mood, Anxiety Symptoms:  Excessive Worry, Psychotic Symptoms:  None reported PTSD Symptoms: Negative Total Time spent with patient: 1 hour  Past Psychiatric History: Patient has a long history of mental health concerns.  Reviewing her old chart it looks like that when she was much younger she was  regularly followed by [facility], which is an agency that primarily works with the developmentally disabled population.  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  This would actually explain quite a bit of the subsequent history.  It does look like other diagnoses have popped up although it is not clear to me whether she has ever really had psychotic symptoms.  She says that she did cut herself when she was [age] years old but has never tried to hurt her self since then.  She denies any recent violence towards others.  She denies any alcohol or drug abuse.  It looks like recently her primary medicines have been lamotrigine and Latuda.  Not clear how long the Latuda has been there because there are notes in the past when she was really only on the lamotrigine.  Is the patient at risk to self? No.  Has the patient been a risk to self in the past 6 months? No.  Has the patient been a risk to self within the distant past? No.  Is the patient a risk to others? No.  Has the patient been a risk to others in the past 6 months? No.  Has the patient been a risk to others within the distant past? No.   Prior Inpatient Therapy:   Prior Outpatient Therapy:    Alcohol Screening: 1. How often do you have a drink containing alcohol?: Never 2. How many drinks containing alcohol do you have on a typical day when you are drinking?: 1 or 2 3. How often do you have six or more drinks on one occasion?: Never AUDIT-C Score: 0 4.  How often during the last year have you found that you were not able to stop drinking once you had started?: Never 5. How often during the last year have you failed to do what was normally expected from you because of drinking?: Never 6. How often during the last year have you needed a first drink in the morning to get yourself going after a heavy drinking session?: Never 7. How often during the last year have you had a  feeling of guilt of remorse after drinking?: Never 8. How often during the last year have you been unable to remember what happened the night before because you had been drinking?: Never 9. Have you or someone else been injured as a result of your drinking?: No 10. Has a relative or friend or a doctor or another health worker been concerned about your drinking or suggested you cut down?: No Alcohol Use Disorder Identification Test Final Score (AUDIT): 0 Alcohol Brief Interventions/Follow-up: AUDIT Score <7 follow-up not indicated Substance Abuse History in the last 12 months:  No. Consequences of Substance Abuse: Negative Previous Psychotropic Medications: Yes  Psychological Evaluations: Yes  Past Medical History:  Past Medical History:  Diagnosis Date  . ADHD (attention deficit hyperactivity disorder)   . Bipolar disorder (HCC)   . Diabetes mellitus, type II (HCC)   . Schizoaffective disorder (HCC)    History reviewed. No pertinent surgical history. Family History:  Family History  Problem Relation Age of Onset  . Drug abuse Mother   . Alcohol abuse Mother   . Alcohol abuse Brother   . Drug abuse Brother   . Alcohol abuse Brother    Family Psychiatric  History: Patient is not aware of any family history Tobacco Screening:   Social History:  Social History   Substance and Sexual Activity  Alcohol Use No  . Alcohol/week: 0.0 standard drinks     Social History   Substance and Sexual Activity  Drug Use No    Additional Social History: Marital status: Single Does patient have children?: No                         Allergies:   Allergies  Allergen Reactions  . Penicillins Rash   Lab Results:  Results for orders placed or performed during the hospital encounter of 07/06/20 (from the past 48 hour(s))  Glucose, capillary     Status: Abnormal   Collection Time: 07/07/20  7:36 AM  Result Value Ref Range   Glucose-Capillary 141 (H) 70 - 99 mg/dL    Comment:  Glucose reference range applies only to samples taken after fasting for at least 8 hours.  Glucose, capillary     Status: Abnormal   Collection Time: 07/07/20 11:25 AM  Result Value Ref Range   Glucose-Capillary 123 (H) 70 - 99 mg/dL    Comment: Glucose reference range applies only to samples taken after fasting for at least 8 hours.    Blood Alcohol level:  Lab Results  Component Value Date   ETH <10 07/05/2020   ETH <10 05/31/2020    Metabolic Disorder Labs:  No results found for: HGBA1C, MPG No results found for: PROLACTIN No results found for: CHOL, TRIG, HDL, CHOLHDL, VLDL, LDLCALC  Current Medications: Current Facility-Administered Medications  Medication Dose Route Frequency Provider Last Rate Last Admin  . chlorproMAZINE (THORAZINE) tablet 100 mg  100 mg Oral QHS Gillermo Murdoch, NP      . darifenacin (ENABLEX) 24 hr  tablet 7.5 mg  7.5 mg Oral Daily Gillermo Murdoch, NP   7.5 mg at 07/07/20 0839  . insulin aspart (novoLOG) injection 12 Units  12 Units Subcutaneous TID WC Gillermo Murdoch, NP   12 Units at 07/07/20 1128  . insulin detemir (LEVEMIR) injection 23 Units  23 Units Subcutaneous QHS Gillermo Murdoch, NP      . lamoTRIgine (LAMICTAL) tablet 200 mg  200 mg Oral Daily Gillermo Murdoch, NP   200 mg at 07/07/20 0839  . loratadine (CLARITIN) tablet 10 mg  10 mg Oral Daily Gillermo Murdoch, NP   10 mg at 07/07/20 0839  . LORazepam (ATIVAN) tablet 1 mg  1 mg Oral Daily PRN Gillermo Murdoch, NP      . lurasidone (LATUDA) tablet 80 mg  80 mg Oral QHS Hakim Minniefield T, MD      . metFORMIN (GLUCOPHAGE) tablet 500 mg  500 mg Oral BID WC Gillermo Murdoch, NP   500 mg at 07/07/20 0839  . metoCLOPramide (REGLAN) tablet 5 mg  5 mg Oral BID Gillermo Murdoch, NP   5 mg at 07/07/20 0839  . traZODone (DESYREL) tablet 100 mg  100 mg Oral QHS Gillermo Murdoch, NP       PTA Medications: Medications Prior to Admission  Medication Sig Dispense  Refill Last Dose  . chlorproMAZINE (THORAZINE) 100 MG tablet Take 100 mg by mouth. Given at 8am and 3pm Take 1 tablet by mouth twice a day as directed 1 prn if tantrum. w/risk to self or others as part of comp behavior plan (max 250mg /24hr)     . insulin aspart (NOVOLOG) 100 UNIT/ML FlexPen Inject 12 Units into the skin 3 (three) times daily with meals.      . insulin detemir (LEVEMIR) 100 UNIT/ML injection Inject 23 Units into the skin at bedtime. Flex pen Given at 8 pm     . lamoTRIgine (LAMICTAL) 150 MG tablet Take 200 mg by mouth daily.      LATUDA 60 MG TABS Take 1 tablet by mouth at bedtime.     Marland Kitchen loratadine (CLARITIN) 10 MG tablet      . LORazepam (ATIVAN) 0.5 MG tablet Take 0.5 mg by mouth 2 (two) times daily as needed for anxiety.     Marland Kitchen LORazepam (ATIVAN) 1 MG tablet Take 1 mg by mouth daily as needed.     . metFORMIN (GLUCOPHAGE) 500 MG tablet Take 500 mg by mouth 2 (two) times daily.     . metoCLOPramide (REGLAN) 10 MG tablet Take by mouth 2 (two) times daily.      Marland Kitchen MYRBETRIQ 50 MG TB24 tablet Take 50 mg by mouth daily.       Musculoskeletal: Strength & Muscle Tone: within normal limits Gait & Station: normal Patient leans: N/A  Psychiatric Specialty Exam: Physical Exam Vitals and nursing note reviewed.  Constitutional:      Appearance: She is well-developed.  HENT:     Head: Normocephalic and atraumatic.  Eyes:     Conjunctiva/sclera: Conjunctivae normal.     Pupils: Pupils are equal, round, and reactive to light.  Cardiovascular:     Heart sounds: Normal heart sounds.  Pulmonary:     Effort: Pulmonary effort is normal.  Abdominal:     Palpations: Abdomen is soft.  Musculoskeletal:        General: Normal range of motion.     Cervical back: Normal range of motion.  Skin:    General: Skin is warm and dry.  Neurological:     General: No focal deficit present.     Mental Status: She is alert.  Psychiatric:        Attention and Perception: Attention normal.         Mood and Affect: Mood is anxious.        Speech: Speech normal.        Behavior: Behavior normal.        Thought Content: Thought content normal.        Cognition and Memory: Cognition is impaired.        Judgment: Judgment is impulsive.     Review of Systems  Constitutional: Negative.   HENT: Negative.   Eyes: Negative.   Respiratory: Negative.   Cardiovascular: Negative.   Gastrointestinal: Negative.   Musculoskeletal: Negative.   Skin: Negative.   Neurological: Negative.   Psychiatric/Behavioral: Negative for self-injury and suicidal ideas. The patient is nervous/anxious.     Blood pressure 140/89, pulse 77, temperature 98.3 F (36.8 C), temperature source Oral, resp. rate 17, height 5\' 6"  (1.676 m), weight 106.1 kg, last menstrual period 07/02/2020, SpO2 98 %.Body mass index is 37.77 kg/m.  General Appearance: Casual  Eye Contact:  Good  Speech:  Clear and Coherent  Volume:  Increased  Mood:  Anxious  Affect:  Congruent  Thought Process:  Goal Directed  Orientation:  Full (Time, Place, and Person)  Thought Content:  Rumination and Tangential  Suicidal Thoughts:  No  Homicidal Thoughts:  No  Memory:  Immediate;   Fair Recent;   Poor Remote;   Poor  Judgement:  Impaired  Insight:  Shallow  Psychomotor Activity:  Normal  Concentration:  Concentration: Fair  Recall:  Poor  Fund of Knowledge:  Poor  Language:  Fair  Akathisia:  No  Handed:  Right  AIMS (if indicated):     Assets:  Desire for Improvement Housing Resilience Social Support  ADL's:  Impaired  Cognition:  Impaired,  Mild  Sleep:  Number of Hours: 6.15    Treatment Plan Summary: Daily contact with patient to assess and evaluate symptoms and progress in treatment, Medication management and Plan 29 year old woman who most likely has some degree of mild intellectual disability and chronic behavior problems comes in after getting irritable losing her temper and breaking up some property at her  group home.  Patient is limited as a historian not really knowing anything about any of the medicines she has been prescribed.  She is currently denying suicidal or homicidal ideation and does not appear to be psychotic.  Patient feels like it is crucial that she be in the hospital to get her medicine adjusted although it is not clear what the target would be.  She indicates that she has been more irritable for the last few months and it is possible that she might be somewhat hypomanic.  My thought is to increase the 26 which is only 60 mg now trying to get it up to 80 or 100 mg.  Reassess behavior here on the unit.  No other specific change to medicine.  Possible length of stay only a couple of days.  Observation Level/Precautions:  15 minute checks  Laboratory:  Chemistry Profile  Psychotherapy:    Medications:    Consultations:    Discharge Concerns:    Estimated LOS:  Other:     Physician Treatment Plan for Primary Diagnosis: Adjustment disorder with mixed disturbance of emotions and conduct Long Term Goal(s): Improvement in symptoms so as ready  for discharge  Short Term Goals: Ability to demonstrate self-control will improve and Ability to identify and develop effective coping behaviors will improve  Physician Treatment Plan for Secondary Diagnosis: Principal Problem:   Adjustment disorder with mixed disturbance of emotions and conduct Active Problems:   Bipolar affective disorder (HCC)   Developmental disability  Long Term Goal(s): Improvement in symptoms so as ready for discharge  Short Term Goals: Ability to maintain clinical measurements within normal limits will improve and Compliance with prescribed medications will improve  I certify that inpatient services furnished can reasonably be expected to improve the patient's condition.    Mordecai Rasmussen, MD 8/11/20213:27 PM

## 2020-07-07 NOTE — Tx Team (Signed)
Initial Treatment Plan 07/07/2020 4:58 AM Alexandria Reynolds    PATIENT STRESSORS: Health problems Medication change or noncompliance   PATIENT STRENGTHS: Ability for insight General fund of knowledge Motivation for treatment/growth   PATIENT IDENTIFIED PROBLEMS: Bipolar Disease     Schizoaffective  Disorder                  DISCHARGE CRITERIA:  Improved stabilization in mood, thinking, and/or behavior Motivation to continue treatment in a less acute level of care  PRELIMINARY DISCHARGE PLAN: Outpatient therapy  PATIENT/FAMILY INVOLVEMENT: This treatment plan has been presented to and reviewed with the patient, Alexandria Reynolds, The patient and family have been given the opportunity to ask questions and make suggestions.  Trula Ore, RN 07/07/2020, 4:58 AM

## 2020-07-08 LAB — GLUCOSE, CAPILLARY
Glucose-Capillary: 148 mg/dL — ABNORMAL HIGH (ref 70–99)
Glucose-Capillary: 150 mg/dL — ABNORMAL HIGH (ref 70–99)
Glucose-Capillary: 170 mg/dL — ABNORMAL HIGH (ref 70–99)

## 2020-07-08 NOTE — Progress Notes (Signed)
BRIEF PHARMACY NOTE   This patient attended and participated in Medication Management Group counseling led by [facility] staff pharmacist.  This interactive class reviews basic information about prescription medications and education on personal responsibility in medication management.  The class also includes general knowledge of 3 main classes of behavioral medications, including antipsychotics, antidepressants, and mood stabilizers.     Patient behavior was appropriate for group setting.    She was a pleasure to have in class.   Educational materials sourced from:  "Medication Do's and Don'ts" from [email or web address]   "Mental Health Medications" from [facility] of Mental Health FaxRack.tn.shtml#part 945859     Albina Billet, PharmD, BCPS Clinical Pharmacist 07/08/2020 2:59 PM

## 2020-07-08 NOTE — Progress Notes (Signed)
Recreation Therapy Notes  Date: 07/08/2020  Time: 9:30 am  Location: Craft room   Behavioral response: Appropriate   Intervention Topic: Animal Assisted Therapy   Discussion/Intervention:  Animal Assisted Therapy took place today during group.  Animal Assisted Therapy is the planned inclusion of an animal in a patient's treatment plan. The patients were able to engage in therapy with an animal during group. Participants were educated on what a service dog is and the different between a support dog and a service dog. Patient were informed on the many animal needs there are and how their needs are similar. Individuals were enlightened on the process to get a service animal or support animal. Patients got the opportunity to pet the animal and were offered emotional support from the animal and staff.  Clinical Observations/Feedback:  Patient came to group and was on topic and was focused on what peers and staff had to say. Participant shared their experiences and history with animals. Individual was social with peers, staff and animal while participating in group.  Shaylynn Nulty LRT/CTRS         Cherre Kothari 07/08/2020 11:44 AM

## 2020-07-08 NOTE — Progress Notes (Signed)
Patient is very active on the unit. Interacting well with others. She is often times observed leading on games and craft sessions in the milieu. She denies SI/HI/AH/VH depression, anxiety and pain at this encounter. She is med compliant and tolerates her medication without incident. She is safe on the unit with 15 minute safety checks and has no problems approaching staff with questions, concerns or needs that she may have.   Cleo Butler-Nicholson, LPN

## 2020-07-08 NOTE — Tx Team (Addendum)
Interdisciplinary Treatment and Diagnostic Plan Update  07/08/2020 Time of Session: 9:00AM Alexandria Reynolds MRN: [id number]  Principal Diagnosis: Adjustment disorder with mixed disturbance of emotions and conduct  Secondary Diagnoses: Principal Problem:   Adjustment disorder with mixed disturbance of emotions and conduct Active Problems:   Bipolar affective disorder (HCC)   Developmental disability   Current Medications:  Current Facility-Administered Medications  Medication Dose Route Frequency Provider Last Rate Last Admin  . chlorproMAZINE (THORAZINE) tablet 100 mg  100 mg Oral QHS Gillermo Murdoch, NP   100 mg at 07/07/20 2213  . darifenacin (ENABLEX) 24 hr tablet 7.5 mg  7.5 mg Oral Daily Gillermo Murdoch, NP   7.5 mg at 07/08/20 0813  . insulin aspart (novoLOG) injection 12 Units  12 Units Subcutaneous TID WC Gillermo Murdoch, NP   12 Units at 07/08/20 [phone]  . insulin detemir (LEVEMIR) injection 23 Units  23 Units Subcutaneous QHS Gillermo Murdoch, NP   23 Units at 07/07/20 2215  . lamoTRIgine (LAMICTAL) tablet 200 mg  200 mg Oral Daily Gillermo Murdoch, NP   200 mg at 07/08/20 0813  . loratadine (CLARITIN) tablet 10 mg  10 mg Oral Daily Gillermo Murdoch, NP   10 mg at 07/08/20 0813  . LORazepam (ATIVAN) tablet 1 mg  1 mg Oral Daily PRN Gillermo Murdoch, NP      . lurasidone (LATUDA) tablet 80 mg  80 mg Oral QHS Clapacs, Jackquline Denmark, MD   80 mg at 07/07/20 2213  . metFORMIN (GLUCOPHAGE) tablet 500 mg  500 mg Oral BID WC Gillermo Murdoch, NP   500 mg at 07/08/20 0813  . metoCLOPramide (REGLAN) tablet 5 mg  5 mg Oral BID Gillermo Murdoch, NP   5 mg at 07/08/20 0813  . traZODone (DESYREL) tablet 100 mg  100 mg Oral QHS Gillermo Murdoch, NP   100 mg at 07/07/20 2213   PTA Medications: Medications Prior to Admission  Medication Sig Dispense Refill Last Dose  . chlorproMAZINE (THORAZINE) 100 MG tablet Take 100 mg by mouth. Given at 8am and 3pm Take  1 tablet by mouth twice a day as directed 1 prn if tantrum. w/risk to self or others as part of comp behavior plan (max 250mg )     . insulin aspart (NOVOLOG) 100 UNIT/ML FlexPen Inject 12 Units into the skin 3 (three) times daily with meals.      . insulin detemir (LEVEMIR) 100 UNIT/ML injection Inject 23 Units into the skin at bedtime. Flex pen Given at 8 pm     . lamoTRIgine (LAMICTAL) 150 MG tablet Take 200 mg by mouth daily.      Alexandria Reynolds LATUDA 60 MG TABS Take 1 tablet by mouth at bedtime.     Marland Kitchen loratadine (CLARITIN) 10 MG tablet      . LORazepam (ATIVAN) 0.5 MG tablet Take 0.5 mg by mouth 2 (two) times daily as needed for anxiety.     Marland Kitchen LORazepam (ATIVAN) 1 MG tablet Take 1 mg by mouth daily as needed.     . metFORMIN (GLUCOPHAGE) 500 MG tablet Take 500 mg by mouth 2 (two) times daily.     . metoCLOPramide (REGLAN) 10 MG tablet Take by mouth 2 (two) times daily.      Marland Kitchen MYRBETRIQ 50 MG TB24 tablet Take 50 mg by mouth daily.       Patient Stressors: Health problems Medication change or noncompliance  Patient Strengths: Ability for insight General fund of knowledge Motivation for treatment/growth  Treatment Modalities: Medication  Management, Group therapy, Case management,  1 to 1 session with clinician, Psychoeducation, Recreational therapy.   Physician Treatment Plan for Primary Diagnosis: Adjustment disorder with mixed disturbance of emotions and conduct Long Term Goal(s): Improvement in symptoms so as ready for discharge Improvement in symptoms so as ready for discharge   Short Term Goals: Ability to demonstrate self-control will improve Ability to identify and develop effective coping behaviors will improve Ability to maintain clinical measurements within normal limits will improve Compliance with prescribed medications will improve  Medication Management: Evaluate patient's response, side effects, and tolerance of medication regimen.  Therapeutic Interventions: 1 to 1  sessions, Unit Group sessions and Medication administration.  Evaluation of Outcomes: Progressing  Physician Treatment Plan for Secondary Diagnosis: Principal Problem:   Adjustment disorder with mixed disturbance of emotions and conduct Active Problems:   Bipolar affective disorder (HCC)   Developmental disability  Long Term Goal(s): Improvement in symptoms so as ready for discharge Improvement in symptoms so as ready for discharge   Short Term Goals: Ability to demonstrate self-control will improve Ability to identify and develop effective coping behaviors will improve Ability to maintain clinical measurements within normal limits will improve Compliance with prescribed medications will improve     Medication Management: Evaluate patient's response, side effects, and tolerance of medication regimen.  Therapeutic Interventions: 1 to 1 sessions, Unit Group sessions and Medication administration.  Evaluation of Outcomes: Progressing   RN Treatment Plan for Primary Diagnosis: Adjustment disorder with mixed disturbance of emotions and conduct Long Term Goal(s): Knowledge of disease and therapeutic regimen to maintain health will improve  Short Term Goals: Ability to verbalize frustration and anger appropriately will improve, Ability to demonstrate self-control, Ability to participate in decision making will improve, Ability to verbalize feelings will improve, Ability to identify and develop effective coping behaviors will improve and Compliance with prescribed medications will improve  Medication Management: RN will administer medications as ordered by provider, will assess and evaluate patient's response and provide education to patient for prescribed medication. RN will report any adverse and/or side effects to prescribing provider.  Therapeutic Interventions: 1 on 1 counseling sessions, Psychoeducation, Medication administration, Evaluate responses to treatment, Monitor vital signs and  CBGs as ordered, Perform/monitor CIWA, COWS, AIMS and Fall Risk screenings as ordered, Perform wound care treatments as ordered.  Evaluation of Outcomes: Progressing   LCSW Treatment Plan for Primary Diagnosis: Adjustment disorder with mixed disturbance of emotions and conduct Long Term Goal(s): Safe transition to appropriate next level of care at discharge, Engage patient in therapeutic group addressing interpersonal concerns.  Short Term Goals: Engage patient in aftercare planning with referrals and resources, Increase social support, Increase ability to appropriately verbalize feelings, Increase emotional regulation, Facilitate acceptance of mental health diagnosis and concerns and Increase skills for wellness and recovery  Therapeutic Interventions: Assess for all discharge needs, 1 to 1 time with Social worker, Explore available resources and support systems, Assess for adequacy in community support network, Educate family and significant other(s) on suicide prevention, Complete Psychosocial Assessment, Interpersonal group therapy.  Evaluation of Outcomes: Progressing   Progress in Treatment: Attending groups: Yes. Participating in groups: Yes. Taking medication as prescribed: Yes. Toleration medication: Yes. Family/Significant other contact made: Yes, individual(s) contacted:  SPE completed with the patient's legal guardian. Patient understands diagnosis: Yes. Discussing patient identified problems/goals with staff: Yes. Medical problems stabilized or resolved: Yes. Denies suicidal/homicidal ideation: Yes. Issues/concerns per patient self-inventory: No. Other: none  New problem(s) identified: No, Describe:  none  New  Short Term/Long Term Goal(s): elimination of symptoms of psychosis, medication management for mood stabilization; elimination of SI thoughts; development of comprehensive mental wellness/sobriety plan.  Patient Goals:  "to get my meds adjusted"  Discharge Plan or  Barriers: "Patient reports plans to return to her group home.  CSW will assist with setting up any aftercare needs.  Reason for Continuation of Hospitalization: Aggression Anxiety Depression Medication stabilization Suicidal ideation  Estimated Length of Stay:  1-7 days  Recreational Therapy: Patient: N/A Patient Goal: Patient will engage in groups without prompting or encouragement from LRT x3 group sessions within 5 recreation therapy group sessions  Attendees: Patient:  Kaitlin Fana 07/08/2020 10:13 AM  Physician:Dr. Toni Amend, MD 07/08/2020 10:13 AM  Nursing: Cecille Amsterdam, RN 07/08/2020 10:13 AM  RN Care Manager: 07/08/2020 10:13 AM  Social Worker: Penni Homans, MSW, LCSW 07/08/2020 10:13 AM  Recreational Therapist: Garret Reddish, CTRS, LRT 07/08/2020 10:13 AM  Other:  07/08/2020 10:13 AM  Other:  07/08/2020 10:13 AM  Other: 07/08/2020 10:13 AM    Scribe for Treatment Team: Harden Mo, LCSW 07/08/2020 10:13 AM

## 2020-07-08 NOTE — Progress Notes (Signed)
[facility] MD Progress Note  07/08/2020 3:47 PM Alexandria Reynolds  MRN:  [id number] Subjective: Follow-up for this patient with bipolar disorder intellectual disability and recent adjustment stresses.  Patient has no specific new complaints.  She says that she has had a good day today.  She has not lost her temper or gotten depressed.  She has been interacting with others appropriately.  Does not report any specific side effects of any medicine.  Does not appear to be grossly psychotic. Principal Problem: Adjustment disorder with mixed disturbance of emotions and conduct Diagnosis: Principal Problem:   Adjustment disorder with mixed disturbance of emotions and conduct Active Problems:   Bipolar affective disorder (HCC)   Developmental disability  Total Time spent with patient: 30 minutes  Past Psychiatric History: Past history of chronic developmental disability and mental health issues going back into childhood  Past Medical History:  Past Medical History:  Diagnosis Date  . ADHD (attention deficit hyperactivity disorder)   . Bipolar disorder (HCC)   . Diabetes mellitus, type II (HCC)   . Schizoaffective disorder (HCC)    History reviewed. No pertinent surgical history. Family History:  Family History  Problem Relation Age of Onset  . Drug abuse Mother   . Alcohol abuse Mother   . Alcohol abuse Brother   . Drug abuse Brother   . Alcohol abuse Brother    Family Psychiatric  History: See previous Social History:  Social History   Substance and Sexual Activity  Alcohol Use No  . Alcohol/week: 0.0 standard drinks     Social History   Substance and Sexual Activity  Drug Use No    Social History   Socioeconomic History  . Marital status: Single    Spouse name: Not on file  . Number of children: Not on file  . Years of education: Not on file  . Highest education level: Not on file  Occupational History  . Not on file  Tobacco Use  . Smoking status: Current Every Day Smoker     Packs/day: 0.05    Types: Cigarettes    Start date: 02/14/2014  . Smokeless tobacco: Never Used  Vaping Use  . Vaping Use: Never used  Substance and Sexual Activity  . Alcohol use: No    Alcohol/week: 0.0 standard drinks  . Drug use: No  . Sexual activity: Never  Other Topics Concern  . Not on file  Social History Narrative  . Not on file   Social Determinants of Health   Financial Resource Strain:   . Difficulty of Paying Living Expenses:   Food Insecurity:   . Worried About Programme researcher, broadcasting/film/video in the Last Year:   . Barista in the Last Year:   Transportation Needs:   . Freight forwarder (Medical):   Marland Kitchen Lack of Transportation (Non-Medical):   Physical Activity:   . Days of Exercise per Week:   . Minutes of Exercise per Session:   Stress:   . Feeling of Stress :   Social Connections:   . Frequency of Communication with Friends and Family:   . Frequency of Social Gatherings with Friends and Family:   . Attends Religious Services:   . Active Member of Clubs or Organizations:   . Attends Banker Meetings:   Marland Kitchen Marital Status:    Additional Social History:  Sleep: Fair  Appetite:  Fair  Current Medications: Current Facility-Administered Medications  Medication Dose Route Frequency Provider Last Rate Last Admin  . chlorproMAZINE (THORAZINE) tablet 100 mg  100 mg Oral QHS Gillermo Murdoch, NP   100 mg at 07/07/20 2213  . darifenacin (ENABLEX) 24 hr tablet 7.5 mg  7.5 mg Oral Daily Gillermo Murdoch, NP   7.5 mg at 07/08/20 0813  . insulin aspart (novoLOG) injection 12 Units  12 Units Subcutaneous TID WC Gillermo Murdoch, NP   12 Units at 07/08/20 1149  . insulin detemir (LEVEMIR) injection 23 Units  23 Units Subcutaneous QHS Gillermo Murdoch, NP   23 Units at 07/07/20 2215  . lamoTRIgine (LAMICTAL) tablet 200 mg  200 mg Oral Daily Gillermo Murdoch, NP   200 mg at 07/08/20 0813  . loratadine  (CLARITIN) tablet 10 mg  10 mg Oral Daily Gillermo Murdoch, NP   10 mg at 07/08/20 0813  . LORazepam (ATIVAN) tablet 1 mg  1 mg Oral Daily PRN Gillermo Murdoch, NP      . lurasidone (LATUDA) tablet 80 mg  80 mg Oral QHS Tyne Banta, Jackquline Denmark, MD   80 mg at 07/07/20 2213  . metFORMIN (GLUCOPHAGE) tablet 500 mg  500 mg Oral BID WC Gillermo Murdoch, NP   500 mg at 07/08/20 0813  . metoCLOPramide (REGLAN) tablet 5 mg  5 mg Oral BID Gillermo Murdoch, NP   5 mg at 07/08/20 0813  . traZODone (DESYREL) tablet 100 mg  100 mg Oral QHS Gillermo Murdoch, NP   100 mg at 07/07/20 2213    Lab Results:  Results for orders placed or performed during the hospital encounter of 07/06/20 (from the past 48 hour(s))  Glucose, capillary     Status: Abnormal   Collection Time: 07/07/20  7:36 AM  Result Value Ref Range   Glucose-Capillary 141 (H) 70 - 99 mg/dL    Comment: Glucose reference range applies only to samples taken after fasting for at least 8 hours.  Glucose, capillary     Status: Abnormal   Collection Time: 07/07/20 11:25 AM  Result Value Ref Range   Glucose-Capillary 123 (H) 70 - 99 mg/dL    Comment: Glucose reference range applies only to samples taken after fasting for at least 8 hours.  Glucose, capillary     Status: Abnormal   Collection Time: 07/07/20  4:01 PM  Result Value Ref Range   Glucose-Capillary 111 (H) 70 - 99 mg/dL    Comment: Glucose reference range applies only to samples taken after fasting for at least 8 hours.   Comment 1 Notify RN   Glucose, capillary     Status: Abnormal   Collection Time: 07/08/20  6:54 AM  Result Value Ref Range   Glucose-Capillary 148 (H) 70 - 99 mg/dL    Comment: Glucose reference range applies only to samples taken after fasting for at least 8 hours.  Glucose, capillary     Status: Abnormal   Collection Time: 07/08/20 11:33 AM  Result Value Ref Range   Glucose-Capillary 150 (H) 70 - 99 mg/dL    Comment: Glucose reference range applies  only to samples taken after fasting for at least 8 hours.    Blood Alcohol level:  Lab Results  Component Value Date   ETH <10 07/05/2020   ETH <10 05/31/2020    Metabolic Disorder Labs: No results found for: HGBA1C, MPG No results found for: PROLACTIN No results found for: CHOL, TRIG, HDL, CHOLHDL, VLDL, LDLCALC  Physical Findings: AIMS:  , ,  ,  ,    CIWA:    COWS:     Musculoskeletal: Strength & Muscle Tone: within normal limits Gait & Station: normal Patient leans: N/A  Psychiatric Specialty Exam: Physical Exam Vitals and nursing note reviewed.  Constitutional:      Appearance: She is well-developed.  HENT:     Head: Normocephalic and atraumatic.  Eyes:     Conjunctiva/sclera: Conjunctivae normal.     Pupils: Pupils are equal, round, and reactive to light.  Cardiovascular:     Heart sounds: Normal heart sounds.  Pulmonary:     Effort: Pulmonary effort is normal.  Abdominal:     Palpations: Abdomen is soft.  Musculoskeletal:        General: Normal range of motion.     Cervical back: Normal range of motion.  Skin:    General: Skin is warm and dry.  Neurological:     General: No focal deficit present.     Mental Status: She is alert.  Psychiatric:        Attention and Perception: Attention normal.        Mood and Affect: Mood normal.        Speech: Speech normal.     Review of Systems  Constitutional: Negative.   HENT: Negative.   Eyes: Negative.   Respiratory: Negative.   Cardiovascular: Negative.   Gastrointestinal: Negative.   Musculoskeletal: Negative.   Skin: Negative.   Neurological: Negative.   Psychiatric/Behavioral: Negative.     Blood pressure (!) 141/87, pulse 97, temperature 98.5 F (36.9 C), temperature source Oral, resp. rate 17, height 5\' 6"  (1.676 m), weight 106.1 kg, last menstrual period 07/02/2020, SpO2 99 %.Body mass index is 37.77 kg/m.  General Appearance: Casual  Eye Contact:  Good  Speech:  Clear and Coherent  Volume:   Normal  Mood:  Euthymic  Affect:  Congruent  Thought Process:  Goal Directed  Orientation:  Full (Time, Place, and Person)  Thought Content:  Logical  Suicidal Thoughts:  No  Homicidal Thoughts:  No  Memory:  Immediate;   Fair Recent;   Fair Remote;   Poor  Judgement:  Fair  Insight:  Fair  Psychomotor Activity:  Normal  Concentration:  Concentration: Fair  Recall:  09/01/2020 of Knowledge:  Fair  Language:  Fair  Akathisia:  No  Handed:  Right  AIMS (if indicated):     Assets:  Desire for Improvement Housing  ADL's:  Intact  Cognition:  WNL  Sleep:  Number of Hours: 5.45     Treatment Plan Summary: Plan Patient is pretty stable and doing well on her current medicine.  She expresses appreciation for the groups and the treatment.  No change to current treatment plan reassess tomorrow possible length of stay 1 to 2 days  [employer], MD 07/08/2020, 3:47 PM

## 2020-07-08 NOTE — Plan of Care (Signed)
Patient calm and pleasant on the unit this evening. Denies SI/HI/AVH  Problem: Education: Goal: Emotional status will improve Outcome: Progressing Goal: Mental status will improve Outcome: Progressing

## 2020-07-08 NOTE — Progress Notes (Signed)
Patent calm and pleasant during assessment denying SI/HI/AVH with this Clinical research associate. Patient observed interacting appropriately with staff and peers on the unit. Patient compliant with medication administration per MD orders. Pt given education, support, and encouragement to be active in her treatment plan. Pt being monitored Q 15 minutes for safety per unit protocol. Patient remains safe on the unit.

## 2020-07-08 NOTE — Progress Notes (Signed)
Recreation Therapy Notes  INPATIENT RECREATION THERAPY ASSESSMENT  Patient Details Name: Alexandria Reynolds MRN: [id number] DOB: [birth year] Today's Date: 07/08/2020       Information Obtained From: Patient  Able to Participate in Assessment/Interview: Yes  Patient Presentation: Responsive  Reason for Admission (Per Patient): Active Symptoms, Impulsive Behavior  Patient Stressors:    Coping Skills:   Music, Art, Other (Comment) (Smoke)  Leisure Interests (2+):  Art - Draw, Art - Coloring, Games - Cards, Sports - Basketball  Frequency of Recreation/Participation: Chief Executive Officer of Community Resources:  Yes  Community Resources:  [employer], Engineering geologist  Current Use: No  If no, Barriers?:  (COVID-19)  Expressed Interest in [employer] Information:    [place] of Residence:  Film/video editor  Patient Main Form of Transportation: Other (Comment) (Group home)  Patient Strengths:  Outgoing, pretty smile, funny  Patient Identified Areas of Improvement:  Deal with anger better  Patient Goal for Hospitalization:  Get medication adjusted and get help  Current SI (including self-harm):  No  Current HI:  No  Current AVH: No  Staff Intervention Plan: Group Attendance, Collaborate with Interdisciplinary Treatment Team  Consent to Intern Participation: N/A  Kambry Takacs 07/08/2020, 3:57 PM

## 2020-07-08 NOTE — Plan of Care (Signed)
  Problem: Education: Goal: Knowledge of [place] [facility] Education information/materials will improve Outcome: Progressing Goal: Emotional status will improve Outcome: Progressing Goal: Mental status will improve Outcome: Progressing Goal: Verbalization of understanding the information provided will improve Outcome: Progressing   Problem: Activity: Goal: Interest or engagement in activities will improve Outcome: Progressing Goal: Sleeping patterns will improve Outcome: Progressing   Problem: Coping: Goal: Ability to verbalize frustrations and anger appropriately will improve Outcome: Progressing Goal: Ability to demonstrate self-control will improve Outcome: Progressing   Problem: Health Behavior/Discharge Planning: Goal: Identification of resources available to assist in meeting health care needs will improve Outcome: Progressing Goal: Compliance with treatment plan for underlying cause of condition will improve Outcome: Progressing   Problem: Physical Regulation: Goal: Ability to maintain clinical measurements within normal limits will improve Outcome: Progressing   Problem: Safety: Goal: Periods of time without injury will increase Outcome: Progressing   Problem: Education: Goal: Ability to make informed decisions regarding treatment will improve Outcome: Progressing   Problem: Coping: Goal: Coping ability will improve Outcome: Progressing   Problem: Health Behavior/Discharge Planning: Goal: Identification of resources available to assist in meeting health care needs will improve Outcome: Progressing   Problem: Medication: Goal: Compliance with prescribed medication regimen will improve Outcome: Progressing   Problem: Self-Concept: Goal: Ability to disclose and discuss suicidal ideas will improve Outcome: Progressing Goal: Will verbalize positive feelings about self Outcome: Progressing   

## 2020-07-08 NOTE — Plan of Care (Signed)
Patient is pleasant and active in the milieu.Denies SI,HI and AVH.Patient stated that she is having a good day nothing triggers her anger.Compliant with medications.Attended groups.Appetite and energy level good.Support and encouragement given.

## 2020-07-09 DIAGNOSIS — F259 Schizoaffective disorder, unspecified: Secondary | ICD-10-CM | POA: Diagnosis present

## 2020-07-09 LAB — GLUCOSE, CAPILLARY
Glucose-Capillary: 155 mg/dL — ABNORMAL HIGH (ref 70–99)
Glucose-Capillary: 177 mg/dL — ABNORMAL HIGH (ref 70–99)
Glucose-Capillary: 113 mg/dL — ABNORMAL HIGH (ref 70–99)

## 2020-07-09 MED ORDER — ALUM & MAG HYDROXIDE-SIMETH 200-200-20 MG/5ML PO SUSP: 30.0000 mL | ORAL | Status: DC | PRN

## 2020-07-09 MED ORDER — DARIFENACIN HYDROBROMIDE ER 7.5 MG PO TB24: 7.5000 mg | ORAL_TABLET | Freq: Every day | ORAL | 0 refills | Status: DC

## 2020-07-09 MED ORDER — ACETAMINOPHEN 325 MG PO TABS: 650.0000 mg | ORAL_TABLET | Freq: Four times a day (QID) | ORAL | Status: DC | PRN

## 2020-07-09 MED ORDER — LORAZEPAM 1 MG PO TABS: 1.0000 mg | ORAL_TABLET | Freq: Every day | ORAL | 0 refills | Status: DC | PRN

## 2020-07-09 MED ORDER — LAMOTRIGINE 200 MG PO TABS: 200.0000 mg | ORAL_TABLET | Freq: Every day | ORAL | 0 refills | Status: DC

## 2020-07-09 MED ORDER — LURASIDONE HCL 80 MG PO TABS: 80.0000 mg | ORAL_TABLET | Freq: Every day | ORAL | 0 refills | Status: DC

## 2020-07-09 MED ORDER — LORATADINE 10 MG PO TABS: 10.0000 mg | ORAL_TABLET | Freq: Every day | ORAL | 0 refills | Status: DC

## 2020-07-09 MED ORDER — METOCLOPRAMIDE HCL 5 MG PO TABS: 5.0000 mg | ORAL_TABLET | Freq: Two times a day (BID) | ORAL | 0 refills | Status: DC

## 2020-07-09 MED ORDER — MAGNESIUM HYDROXIDE 400 MG/5ML PO SUSP: 30.0000 mL | Freq: Every day | ORAL | Status: DC | PRN

## 2020-07-09 MED ORDER — TRAZODONE HCL 100 MG PO TABS: 100.0000 mg | ORAL_TABLET | Freq: Every day | ORAL | 0 refills | Status: DC

## 2020-07-09 MED ORDER — INSULIN DETEMIR 100 UNIT/ML ~~LOC~~ SOLN: 23.0000 [IU] | Freq: Every day | SUBCUTANEOUS | 0 refills | Status: DC

## 2020-07-09 MED ORDER — METFORMIN HCL 500 MG PO TABS: 500.0000 mg | ORAL_TABLET | Freq: Two times a day (BID) | ORAL | 0 refills | Status: DC

## 2020-07-09 MED ORDER — INSULIN ASPART 100 UNIT/ML ~~LOC~~ SOLN: 12.0000 [IU] | Freq: Three times a day (TID) | SUBCUTANEOUS | 0 refills | Status: DC

## 2020-07-09 MED ORDER — CHLORPROMAZINE HCL 100 MG PO TABS: 100.0000 mg | ORAL_TABLET | Freq: Every day | ORAL | 0 refills | Status: DC

## 2020-07-09 NOTE — Plan of Care (Signed)
Patient is out of bed and active in the milieu. Alert and oriented and denying suicidal thoughts. Denying hallucinations. Reports that she slept well and expressing readiness for discharge. Ate breakfast and received medications. Has not expressed any concerns  so far. Encouragements provided. Safety monitored as expected.

## 2020-07-09 NOTE — BHH Counselor (Signed)
CSW attempted to schedule the patient aftercare appointment, however, her provider declined to schedule until they have a copy of the discharge summary.   CSW can not sent at this time as it has not been completed.    CSW explained that it is possible that guardian/group home would have to follow up for scheduling patient.   CSW made guardian aware of barrier to scheduling for appointment.   Penni Homans, MSW, LCSW 07/09/2020 11:29 AM

## 2020-07-09 NOTE — Discharge Summary (Signed)
Physician Discharge Summary Note  Patient:  Alexandria Reynolds is an 29 y.o., female MRN:  [id number] DOB:  1991-03-14 Patient phone:  [phone] (home)  Patient address:   [facility] [address] [place] [address],  Total Time spent with patient: 30 minutes  Date of Admission:  07/06/2020 Date of Discharge: 07/09/2020  Reason for Admission: Patient presented to the emergency room with recent worsening of mood, irritability, breaking property at her group home and losing her temper.  Patient felt that her mood needed some help in getting under control.  Principal Problem: Adjustment disorder with mixed disturbance of emotions and conduct Discharge Diagnoses: Principal Problem:   Adjustment disorder with mixed disturbance of emotions and conduct Active Problems:   Bipolar affective disorder (HCC)   Developmental disability   Past Psychiatric History: History of longstanding chronic developmental disability and mood instability with institutional living  Past Medical History:  Past Medical History:  Diagnosis Date  . ADHD (attention deficit hyperactivity disorder)   . Bipolar disorder (HCC)   . Diabetes mellitus, type II (HCC)   . Schizoaffective disorder (HCC)    History reviewed. No pertinent surgical history. Family History:  Family History  Problem Relation Age of Onset  . Drug abuse Mother   . Alcohol abuse Mother   . Alcohol abuse Brother   . Drug abuse Brother   . Alcohol abuse Brother    Family Psychiatric  History: None reported Social History:  Social History   Substance and Sexual Activity  Alcohol Use No  . Alcohol/week: 0.0 standard drinks     Social History   Substance and Sexual Activity  Drug Use No    Social History   Socioeconomic History  . Marital status: Single    Spouse name: Not on file  . Number of children: Not on file  . Years of education: Not on file  . Highest education level: Not on file  Occupational  History  . Not on file  Tobacco Use  . Smoking status: Current Every Day Smoker    Packs/day: 0.05    Types: Cigarettes    Start date: 02/14/2014  . Smokeless tobacco: Never Used  Vaping Use  . Vaping Use: Never used  Substance and Sexual Activity  . Alcohol use: No    Alcohol/week: 0.0 standard drinks  . Drug use: No  . Sexual activity: Never  Other Topics Concern  . Not on file  Social History Narrative  . Not on file   Social Determinants of Health   Financial Resource Strain:   . Difficulty of Paying Living Expenses:   Food Insecurity:   . Worried About Programme researcher, broadcasting/film/video in the Last Year:   . Barista in the Last Year:   Transportation Needs:   . Freight forwarder (Medical):   Marland Kitchen Lack of Transportation (Non-Medical):   Physical Activity:   . Days of Exercise per Week:   . Minutes of Exercise per Session:   Stress:   . Feeling of Stress :   Social Connections:   . Frequency of Communication with Friends and Family:   . Frequency of Social Gatherings with Friends and Family:   . Attends Religious Services:   . Active Member of Clubs or Organizations:   . Attends Banker Meetings:   Marland Kitchen Marital Status:     Hospital Course: Patient was kept on 15-minute checks.  She did not show any dangerous or aggressive behavior.  She  was cooperative and pleasant during her time in the hospital.  She attended groups and interacted appropriately with peers and staff.  She showed good insight.  Latuda dose was increased to 80 mg.  Patient tolerated that without any side effects or complaints.  She is not showing any acute signs of psychosis and she denies any suicidal or homicidal ideation.  Patient appears to have stabilized with good improvement in her mood and has a clear plan for continued medication management and work on coping skills.  Guardian notified about discharge plan and is in agreement.  Prescriptions provided at discharge.  Physical  Findings: AIMS:  , ,  ,  ,    CIWA:    COWS:     Musculoskeletal: Strength & Muscle Tone: within normal limits Gait & Station: normal Patient leans: N/A  Psychiatric Specialty Exam: Physical Exam Vitals and nursing note reviewed.  Constitutional:      Appearance: She is well-developed.  HENT:     Head: Normocephalic and atraumatic.  Eyes:     Conjunctiva/sclera: Conjunctivae normal.     Pupils: Pupils are equal, round, and reactive to light.  Cardiovascular:     Heart sounds: Normal heart sounds.  Pulmonary:     Effort: Pulmonary effort is normal.  Abdominal:     Palpations: Abdomen is soft.  Musculoskeletal:        General: Normal range of motion.     Cervical back: Normal range of motion.  Skin:    General: Skin is warm and dry.  Neurological:     General: No focal deficit present.     Mental Status: She is alert.  Psychiatric:        Mood and Affect: Mood normal.     Review of Systems  Constitutional: Negative.   HENT: Negative.   Eyes: Negative.   Respiratory: Negative.   Cardiovascular: Negative.   Gastrointestinal: Negative.   Musculoskeletal: Negative.   Skin: Negative.   Neurological: Negative.   Psychiatric/Behavioral: Negative.     Blood pressure 124/80, pulse 87, temperature 98.1 F (36.7 C), temperature source Oral, resp. rate 17, height 5\' 6"  (1.676 m), weight 106.1 kg, last menstrual period 07/02/2020, SpO2 98 %.Body mass index is 37.77 kg/m.  General Appearance: Casual  Eye Contact:  Good  Speech:  Clear and Coherent  Volume:  Normal  Mood:  Euthymic  Affect:  Congruent  Thought Process:  Coherent  Orientation:  Full (Time, Place, and Person)  Thought Content:  Logical  Suicidal Thoughts:  No  Homicidal Thoughts:  No  Memory:  Immediate;   Fair Recent;   Fair Remote;   Fair  Judgement:  Fair  Insight:  Fair  Psychomotor Activity:  Normal  Concentration:  Concentration: Fair  Recall:  09/01/2020 of Knowledge:  Fair  Language:   Fair  Akathisia:  No  Handed:  Right  AIMS (if indicated):     Assets:  Desire for Improvement Housing Physical Health Resilience Social Support  ADL's:  Intact  Cognition:  Impaired,  Mild  Sleep:  Number of Hours: 5.45        Has this patient used any form of tobacco in the last 30 days? (Cigarettes, Smokeless Tobacco, Cigars, and/or Pipes) Yes, No  Blood Alcohol level:  Lab Results  Component Value Date   ETH <10 07/05/2020   ETH <10 05/31/2020    Metabolic Disorder Labs:  No results found for: HGBA1C, MPG No results found for: PROLACTIN No results found  for: CHOL, TRIG, HDL, CHOLHDL, VLDL, LDLCALC  See Psychiatric Specialty Exam and Suicide Risk Assessment completed by Attending Physician prior to discharge.  Discharge destination:  Home  Is patient on multiple antipsychotic therapies at discharge:  Yes,   Do you recommend tapering to monotherapy for antipsychotics?  No   Has Patient had three or more failed trials of antipsychotic monotherapy by history:  Yes,   Antipsychotic medications that previously failed include:   1.  Risperdal., 2.  Kasandra Knudsen. and 3.  Thorazine.  Recommended Plan for Multiple Antipsychotic Therapies: NA     Follow-up Information    [facility] Follow up.   Why: Follow up appointment can only be scheduled once the dishcarge summary has been received by the [facility] Medical Records.  Please  Contact information: [address], [place], [place] [address] Phone: [phone] Fax: [phone]              Follow-up recommendations:  Activity:  Activity as tolerated Diet:  Regular diet Other:  Follow-up with outpatient treatment with [facility]  Comments: Prescriptions provided at discharge  Signed: Mordecai Rasmussen, MD 07/09/2020, 11:59 AM

## 2020-07-09 NOTE — Progress Notes (Signed)
Recreation Therapy Notes  INPATIENT RECREATION TR PLAN  Patient Details Name: Alexandria Reynolds MRN: [id number] DOB: 1991/06/20 Today's Date: 07/09/2020  Rec Therapy Plan Is patient appropriate for Therapeutic Recreation?: Yes Treatment times per week: At least 3 Estimated Length of Stay: 5-7 days TR Treatment/Interventions: Group participation (Comment)  Discharge Criteria Pt will be discharged from therapy if:: Discharged Treatment plan/goals/alternatives discussed and agreed upon by:: Patient/family  Discharge Summary Short term goals set: Patient will identify benefit of using appropriate anger management techniques within 5 recreation therapy group sessions Short term goals met: Complete Progress toward goals comments: Groups attended Which groups?: AAA/T, Other (Comment) (Happiness) Reason goals not met: N/A Therapeutic equipment acquired: N/A Reason patient discharged from therapy: Discharge from hospital Pt/family agrees with progress & goals achieved: Yes Date patient discharged from therapy: 07/09/20   Orlondo Holycross 07/09/2020, 12:27 PM

## 2020-07-09 NOTE — Plan of Care (Signed)
Problem: Anger Management Goal: STG - Patient will identify benefit of using appropriate anger management techniques within 5 recreation therapy group sessions Description: STG - Patient will identify benefit of using appropriate anger management techniques within 5 recreation therapy group sessions Outcome: Completed/Met   

## 2020-07-09 NOTE — NC FL2 (Signed)
[place] MEDICAID FL2 LEVEL OF CARE SCREENING TOOL     IDENTIFICATION  Patient Name: Alexandria Reynolds Birthdate: Jan 22, 1991 Sex: female Admission Date (Current Location): 07/06/2020  [place] and [place] Number:  Chiropodist and Address:  [facility], [address] [place], [place] [address]      Provider Number: [id number]  Attending Physician Name and Address:  Audery Amel, MD  Relative Name and Phone Number:       Current Level of Care: Hospital Recommended Level of Care: Other (Comment), Family Care Home (Group Home) Prior Approval Number:    Date Approved/Denied:   PASRR Number:    Discharge Plan: Other (Comment) (Group Home, Family Care Home)    Current Diagnoses: Patient Active Problem List   Diagnosis Date Noted  . 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 Developmental disability 08/01/2016  . Bipolar I disorder (HCC) 03/28/2016  . Bipolar affective disorder (HCC) 02/15/2016  . Schizophrenia (HCC) 02/15/2016  . Asthma, mild intermittent 09/19/2013  . Absence of menstruation 07/08/2013  . Type 2 diabetes mellitus (HCC) 07/08/2013  . H/O infectious disease 07/08/2013  . Adiposity 07/08/2013  . Current smoker 07/08/2013    Orientation RESPIRATION BLADDER Height & Weight     Self, Time, Situation, Place  Normal Continent Weight: 233 lb 15.9 oz (106.1 kg) Height:  5\' 6"  (167.6 cm)  BEHAVIORAL SYMPTOMS/MOOD NEUROLOGICAL BOWEL NUTRITION STATUS  Verbally abusive, Physically abusive (Pt reports that she has displayed this in the past when upset.  She has not displayed this behavior while in the hospital.)  (NA) Continent Diet (Carb modified)  AMBULATORY STATUS COMMUNICATION OF NEEDS Skin   Independent Verbally Normal                       Personal Care Assistance Level of Assistance              Functional Limitations Info   (NA)          SPECIAL CARE FACTORS  FREQUENCY   (Diabetes)                    Contractures Contractures Info: Present    Additional Factors Info  Allergies   Allergies Info: Penicillins           Current Medications (07/09/2020):  This is the current hospital active medication list Current Facility-Administered Medications  Medication Dose Route Frequency Provider Last Rate Last Admin  . chlorproMAZINE (THORAZINE) tablet 100 mg  100 mg Oral QHS 07/11/2020, NP   100 mg at 07/08/20 2111  . darifenacin (ENABLEX) 24 hr tablet 7.5 mg  7.5 mg Oral Daily 2112, NP   7.5 mg at 07/09/20 0932  . insulin aspart (novoLOG) injection 12 Units  12 Units Subcutaneous TID WC 07/11/20, NP   12 Units at 07/09/20 0759  . insulin detemir (LEVEMIR) injection 23 Units  23 Units Subcutaneous QHS 07/11/20, NP   23 Units at 07/08/20 2117  . lamoTRIgine (LAMICTAL) tablet 200 mg  200 mg Oral Daily 2118, NP   200 mg at 07/09/20 0802  . loratadine (CLARITIN) tablet 10 mg  10 mg Oral Daily 07/11/20, NP   10 mg at 07/09/20 0802  . LORazepam (ATIVAN) tablet 1 mg  1 mg Oral Daily PRN 07/11/20, NP      . lurasidone (LATUDA) tablet 80 mg  80 mg Oral QHS Clapacs,  Jackquline Denmark, MD   80 mg at 07/08/20 2111  . metFORMIN (GLUCOPHAGE) tablet 500 mg  500 mg Oral BID WC Gillermo Murdoch, NP   500 mg at 07/09/20 0802  . metoCLOPramide (REGLAN) tablet 5 mg  5 mg Oral BID Gillermo Murdoch, NP   5 mg at 07/09/20 0931  . traZODone (DESYREL) tablet 100 mg  100 mg Oral QHS Gillermo Murdoch, NP   100 mg at 07/08/20 2111     Discharge Medications: Please see discharge summary for a list of discharge medications.  Relevant Imaging Results:  Relevant Lab Results:   Additional Information    Harden Mo, LCSW

## 2020-07-09 NOTE — Progress Notes (Signed)
Recreation Therapy Notes   Date: 07/09/2020  Time: 9:30 am  Location: Craft room   Behavioral response: Appropriate  Intervention Topic: Happiness   Discussion/Intervention:  Group content today was focused on Happiness. The group defined happiness and described where happiness comes from. Individuals identified what makes them happy and how they go about making others happy. Patients expressed things that stop them from being happy and ways they can improve their happiness. The group stated reasons why it is important to be happy. The group participated in the intervention "My Happiness", where they had a chance to identify and express things that make them happy. Clinical Observations/Feedback:  Patient came to group defined happiness as a source of energy you do not get often. She expressed that leaving the hospital today, playing on her phone and smoking cigarettes are things that make her happy. Participant explained that happiness is important for self care. Individual was social with peers and staff while participating in the intervention. Hanif Radin LRT/CTRS         Genie Wenke 07/09/2020 12:02 PM

## 2020-07-09 NOTE — BHH Counselor (Signed)
CSW attempted to reach the patients group home, CSW left HIPAA compliant voicemail.  Penni Homans, MSW, LCSW 07/09/2020 10:17 AM

## 2020-07-09 NOTE — BHH Suicide Risk Assessment (Signed)
[facility] Discharge Suicide Risk Assessment   Principal Problem: Adjustment disorder with mixed disturbance of emotions and conduct Discharge Diagnoses: Principal Problem:   Adjustment disorder with mixed disturbance of emotions and conduct Active Problems:   Bipolar affective disorder (HCC)   Developmental disability   Total Time spent with patient: 30 minutes  Musculoskeletal: Strength & Muscle Tone: within normal limits Gait & Station: normal Patient leans: N/A  Psychiatric Specialty Exam: Review of Systems  Constitutional: Negative.   HENT: Negative.   Eyes: Negative.   Respiratory: Negative.   Cardiovascular: Negative.   Gastrointestinal: Negative.   Musculoskeletal: Negative.   Skin: Negative.   Neurological: Negative.   Psychiatric/Behavioral: Negative.     Blood pressure 124/80, pulse 87, temperature 98.1 F (36.7 C), temperature source Oral, resp. rate 17, height 5\' 6"  (1.676 m), weight 106.1 kg, last menstrual period 07/02/2020, SpO2 98 %.Body mass index is 37.77 kg/m.  General Appearance: Casual  Eye Contact::  Good  Speech:  Clear and Coherent409  Volume:  Normal  Mood:  Euthymic  Affect:  Constricted  Thought Process:  Goal Directed  Orientation:  Full (Time, Place, and Person)  Thought Content:  Logical  Suicidal Thoughts:  No  Homicidal Thoughts:  No  Memory:  Immediate;   Fair Recent;   Fair Remote;   Fair  Judgement:  Fair  Insight:  Fair  Psychomotor Activity:  Normal  Concentration:  Fair  Recall:  [phone] of Knowledge:Fair  Language: Fair  Akathisia:  No  Handed:  Right  AIMS (if indicated):     Assets:  Desire for Improvement  Sleep:  Number of Hours: 5.45  Cognition: Impaired,  Mild  ADL's:  Intact   Mental Status Per Nursing Assessment::   On Admission:  NA  Demographic Factors:  NA  Loss Factors: NA  Historical Factors: Impulsivity  Risk Reduction Factors:   Living with another person, especially a relative, Positive  social support, Positive therapeutic relationship and Positive coping skills or problem solving skills  Continued Clinical Symptoms:  Bipolar Disorder:   Mixed State  Cognitive Features That Contribute To Risk:  None    Suicide Risk:  Minimal: No identifiable suicidal ideation.  Patients presenting with no risk factors but with morbid ruminations; may be classified as minimal risk based on the severity of the depressive symptoms   Follow-up Information    [facility] Follow up.   Why: Follow up appointment can only be scheduled once the dishcarge summary has been received by the [facility] Medical Records.  Please  Contact information: [address], [address], [address] [place], [place] Phone: [phone] Fax: [phone]              Plan Of Care/Follow-up recommendations:  Activity:  as tolerated Diet:  regular Other:  follow up with outpt providers  [phone], MD 07/09/2020, 11:57 AM

## 2020-07-09 NOTE — Progress Notes (Signed)
Patient has become more and more irritable after knowing hat she is being discharged back to the group home. Earlier today, patient stated "I really am going to miss it here; the food is good, y'all have been nice to me.I really like it here". Patient presented to the medication room for medications and mentioned that she does not like  The group home where she resides "they have their favorites. They like the other girls because they don't act act  Up like I do...". patient received medications and was encouraged to utilize her coping skills to manage her emotions.

## 2020-07-09 NOTE — Progress Notes (Signed)
  [facility] Adult Case Management Discharge Plan :  Will you be returning to the same living situation after discharge:  Yes,  pt is returning to the group home.  At discharge, do you have transportation home?: Yes,  group home will provide transportation. Do you have the ability to pay for your medications: Yes,  Medicare Part A and B  Release of information consent forms completed and in the chart;  Patient's signature needed at discharge.  Patient to Follow up at:  Follow-up Information    [facility] Follow up.   Why: Follow up appointment can only be scheduled once the dishcarge summary has been received.  This has been faxed.  Please call to schedule an appointment. Thanks! Contact information: [address] [place], [place] [address] Phone: [phone] Fax: [phone]              Next level of care provider has access to [facility] Link:no  Safety Planning and Suicide Prevention discussed: Yes,  SPE completed with the patients guardian.     Has patient been referred to the Quitline?: Patient refused referral  Patient has been referred for addiction treatment: Pt. refused referral  Harden Mo, LCSW 07/09/2020, 2:39 PM

## 2020-07-09 NOTE — Progress Notes (Signed)
Recreation Therapy Notes   Music Group Date: 07/09/2020   Time: 2:20pm    Location: Craft room     Behavioral response: Appropriate   Rafaelita Foister LRT/CTRS        Alcee Sipos 07/09/2020 2:58 PM

## 2020-07-09 NOTE — Progress Notes (Signed)
Patient discharged back to the group home. Verbalized understanding of discharge instructions. Denied SI/HI and hallucinations upon discharge. No sign of distress upon departure.

## 2020-07-23 ENCOUNTER — Other Ambulatory Visit: Payer: Self-pay | Admitting: Psychiatry

## 2020-07-28 ENCOUNTER — Other Ambulatory Visit: Payer: Self-pay | Admitting: Psychiatry

## 2020-08-16 ENCOUNTER — Other Ambulatory Visit: Payer: Self-pay

## 2020-08-16 DIAGNOSIS — Z794 Long term (current) use of insulin: Secondary | ICD-10-CM | POA: Diagnosis not present

## 2020-08-16 DIAGNOSIS — E876 Hypokalemia: Secondary | ICD-10-CM | POA: Diagnosis not present

## 2020-08-16 DIAGNOSIS — F329 Major depressive disorder, single episode, unspecified: Secondary | ICD-10-CM | POA: Insufficient documentation

## 2020-08-16 DIAGNOSIS — E119 Type 2 diabetes mellitus without complications: Secondary | ICD-10-CM | POA: Insufficient documentation

## 2020-08-16 DIAGNOSIS — R456 Violent behavior: Secondary | ICD-10-CM | POA: Diagnosis not present

## 2020-08-16 DIAGNOSIS — Z20822 Contact with and (suspected) exposure to covid-19: Secondary | ICD-10-CM | POA: Insufficient documentation

## 2020-08-16 DIAGNOSIS — Z79899 Other long term (current) drug therapy: Secondary | ICD-10-CM | POA: Diagnosis not present

## 2020-08-16 DIAGNOSIS — F909 Attention-deficit hyperactivity disorder, unspecified type: Secondary | ICD-10-CM | POA: Insufficient documentation

## 2020-08-16 DIAGNOSIS — I10 Essential (primary) hypertension: Secondary | ICD-10-CM | POA: Insufficient documentation

## 2020-08-16 DIAGNOSIS — M542 Cervicalgia: Secondary | ICD-10-CM | POA: Diagnosis not present

## 2020-08-16 DIAGNOSIS — F17210 Nicotine dependence, cigarettes, uncomplicated: Secondary | ICD-10-CM | POA: Diagnosis not present

## 2020-08-16 DIAGNOSIS — J4520 Mild intermittent asthma, uncomplicated: Secondary | ICD-10-CM | POA: Diagnosis not present

## 2020-08-16 NOTE — ED Notes (Signed)
Pink crocs Black tshirt Black backpack with yellow trim (has items in it) Dark blue pants Blue underwear Single pair of earrings in urine cup Black bra Cell phone  (Bandage on neck was on when she arrived)

## 2020-08-16 NOTE — ED Triage Notes (Signed)
PT to ED with sheriff's office from her group home. Pt got into an argument with staff and had verbal and physical escalation, pt admits to attacking staff, which she says she normally does not do. Pt also admits to feeling alone and unloved in the last few months. PT is calm and cooperative and this time and would like help.

## 2020-08-17 ENCOUNTER — Emergency Department
Admission: EM | Admit: 2020-08-17 | Discharge: 2020-08-17 | Disposition: A | Discharge status: Home / Self Care | Payer: Medicare Other | Attending: Emergency Medicine | Admitting: Emergency Medicine

## 2020-08-17 DIAGNOSIS — F4325 Adjustment disorder with mixed disturbance of emotions and conduct: Secondary | ICD-10-CM

## 2020-08-17 DIAGNOSIS — R4689 Other symptoms and signs involving appearance and behavior: Secondary | ICD-10-CM

## 2020-08-17 DIAGNOSIS — E876 Hypokalemia: Secondary | ICD-10-CM

## 2020-08-17 DIAGNOSIS — R456 Violent behavior: Secondary | ICD-10-CM | POA: Diagnosis not present

## 2020-08-17 DIAGNOSIS — M542 Cervicalgia: Secondary | ICD-10-CM

## 2020-08-17 DIAGNOSIS — I10 Essential (primary) hypertension: Secondary | ICD-10-CM

## 2020-08-17 LAB — COMPREHENSIVE METABOLIC PANEL
ALT: 19 U/L (ref 0–44)
AST: 24 U/L (ref 15–41)
Albumin: 4.6 g/dL (ref 3.5–5.0)
Alkaline Phosphatase: 49 U/L (ref 38–126)
Anion gap: 11 (ref 5–15)
BUN: 15 mg/dL (ref 6–20)
CO2: 21 mmol/L — ABNORMAL LOW (ref 22–32)
Calcium: 9 mg/dL (ref 8.9–10.3)
Chloride: 103 mmol/L (ref 98–111)
Creatinine, Ser: 0.89 mg/dL (ref 0.44–1.00)
GFR calc Af Amer: >60 mL/min (ref >60)
GFR calc non Af Amer: >60 mL/min (ref >60)
Glucose, Bld: 129 mg/dL — ABNORMAL HIGH (ref 70–99)
Potassium: 3 mmol/L — ABNORMAL LOW (ref 3.5–5.1)
Sodium: 135 mmol/L (ref 135–145)
Total Bilirubin: 0.6 mg/dL (ref 0.3–1.2)
Total Protein: 9.1 g/dL — ABNORMAL HIGH (ref 6.5–8.1)

## 2020-08-17 LAB — CBC
HCT: 39.9 % (ref 36.0–46.0)
Hemoglobin: 13.3 g/dL (ref 12.0–15.0)
MCH: 28.1 pg (ref 26.0–34.0)
MCHC: 33.3 g/dL (ref 30.0–36.0)
MCV: 84.4 fL (ref 80.0–100.0)
Platelets: 260 10*3/uL (ref 150–400)
RBC: 4.73 MIL/uL (ref 3.87–5.11)
RDW: 13.1 % (ref 11.5–15.5)
WBC: 6.9 10*3/uL (ref 4.0–10.5)
nRBC: 0 % (ref 0.0–0.2)

## 2020-08-17 LAB — URINE DRUG SCREEN, QUALITATIVE (ARMC ONLY)
Amphetamines, Ur Screen: NOT DETECTED
Barbiturates, Ur Screen: NOT DETECTED
Benzodiazepine, Ur Scrn: POSITIVE — AB
Cannabinoid 50 Ng, Ur ~~LOC~~: NOT DETECTED
Cocaine Metabolite,Ur ~~LOC~~: NOT DETECTED
MDMA (Ecstasy)Ur Screen: NOT DETECTED
Methadone Scn, Ur: NOT DETECTED
Opiate, Ur Screen: NOT DETECTED
Phencyclidine (PCP) Ur S: NOT DETECTED
Tricyclic, Ur Screen: POSITIVE — AB

## 2020-08-17 LAB — SALICYLATE LEVEL: Salicylate Lvl: <7 mg/dL — ABNORMAL LOW (ref 7.0–30.0)

## 2020-08-17 LAB — ETHANOL: Alcohol, Ethyl (B): <10 mg/dL (ref <10)

## 2020-08-17 LAB — SARS CORONAVIRUS 2 BY RT PCR (HOSPITAL ORDER, PERFORMED IN ~~LOC~~ HOSPITAL LAB): SARS Coronavirus 2: NEGATIVE

## 2020-08-17 LAB — POCT PREGNANCY, URINE: Preg Test, Ur: NEGATIVE

## 2020-08-17 LAB — ACETAMINOPHEN LEVEL: Acetaminophen (Tylenol), Serum: <10 ug/mL — ABNORMAL LOW (ref 10–30)

## 2020-08-17 MED ORDER — POTASSIUM CHLORIDE ER 10 MEQ PO TBCR: 10.0000 meq | EXTENDED_RELEASE_TABLET | Freq: Every day | ORAL | 0 refills | Status: DC

## 2020-08-17 MED ORDER — NAPROXEN 500 MG PO TABS
500.0000 mg | ORAL_TABLET | Freq: Once | ORAL | Status: AC
  Administered 2020-08-17: 500 mg via ORAL
  Filled 2020-08-17: qty 1

## 2020-08-17 MED ORDER — ACETAMINOPHEN 500 MG PO TABS
1000.0000 mg | ORAL_TABLET | Freq: Once | ORAL | Status: AC
  Administered 2020-08-17: 1000 mg via ORAL
  Filled 2020-08-17: qty 2

## 2020-08-17 MED ORDER — POTASSIUM CHLORIDE CRYS ER 20 MEQ PO TBCR
40.0000 meq | EXTENDED_RELEASE_TABLET | Freq: Once | ORAL | Status: AC
  Administered 2020-08-17: 40 meq via ORAL
  Filled 2020-08-17: qty 2

## 2020-08-17 MED ORDER — POTASSIUM CHLORIDE ER 10 MEQ PO TBCR
10.0000 meq | EXTENDED_RELEASE_TABLET | Freq: Every day | ORAL | 0 refills | Status: DC
Start: 2020-08-17 — End: 2022-05-06

## 2020-08-17 NOTE — ED Notes (Signed)
Pt given meal tray.

## 2020-08-17 NOTE — ED Notes (Signed)
Called Vince pt's legal guardian and informed him of pt's discharge back to group home at this time

## 2020-08-17 NOTE — Discharge Instructions (Signed)
Follow up with PCP for repeat blood check     Managing Your Hypertension Hypertension is commonly called high blood pressure. This is when the force of your blood pressing against the walls of your arteries is too strong. Arteries are blood vessels that carry blood from your heart throughout your body. Hypertension forces the heart to work harder to pump blood, and may cause the arteries to become narrow or stiff. Having untreated or uncontrolled hypertension can cause heart attack, stroke, kidney disease, and other problems. What are blood pressure readings? A blood pressure reading consists of a higher number over a lower number. Ideally, your blood pressure should be below 120/80. The first ("top") number is called the systolic pressure. It is a measure of the pressure in your arteries as your heart beats. The second ("bottom") number is called the diastolic pressure. It is a measure of the pressure in your arteries as the heart relaxes. What does my blood pressure reading mean? Blood pressure is classified into four stages. Based on your blood pressure reading, your health care provider may use the following stages to determine what type of treatment you need, if any. Systolic pressure and diastolic pressure are measured in a unit called mm Hg. Normal  Systolic pressure: below 120.  Diastolic pressure: below 80. Elevated  Systolic pressure: 120-129.  Diastolic pressure: below 80. Hypertension stage 1  Systolic pressure: 130-139.  Diastolic pressure: 80-89. Hypertension stage 2  Systolic pressure: 140 or above.  Diastolic pressure: 90 or above. What health risks are associated with hypertension? Managing your hypertension is an important responsibility. Uncontrolled hypertension can lead to:  A heart attack.  A stroke.  A weakened blood vessel (aneurysm).  Heart failure.  Kidney damage.  Eye damage.  Metabolic syndrome.  Memory and concentration problems. What  changes can I make to manage my hypertension? Hypertension can be managed by making lifestyle changes and possibly by taking medicines. Your health care provider will help you make a plan to bring your blood pressure within a normal range. Eating and drinking   Eat a diet that is high in fiber and potassium, and low in salt (sodium), added sugar, and fat. An example eating plan is called the DASH (Dietary Approaches to Stop Hypertension) diet. To eat this way: ? Eat plenty of fresh fruits and vegetables. Try to fill half of your plate at each meal with fruits and vegetables. ? Eat whole grains, such as whole wheat pasta, brown rice, or whole grain bread. Fill about one quarter of your plate with whole grains. ? Eat low-fat diary products. ? Avoid fatty cuts of meat, processed or cured meats, and poultry with skin. Fill about one quarter of your plate with lean proteins such as fish, chicken without skin, beans, eggs, and tofu. ? Avoid premade and processed foods. These tend to be higher in sodium, added sugar, and fat.  Reduce your daily sodium intake. Most people with hypertension should eat less than 1,500 mg of sodium a day.  Limit alcohol intake to no more than 1 drink a day for nonpregnant women and 2 drinks a day for men. One drink equals 12 oz of beer, 5 oz of wine, or 1 oz of hard liquor. Lifestyle  Work with your health care provider to maintain a healthy body weight, or to lose weight. Ask what an ideal weight is for you.  Get at least 30 minutes of exercise that causes your heart to beat faster (aerobic exercise) most days of  the week. Activities may include walking, swimming, or biking.  Include exercise to strengthen your muscles (resistance exercise), such as weight lifting, as part of your weekly exercise routine. Try to do these types of exercises for 30 minutes at least 3 days a week.  Do not use any products that contain nicotine or tobacco, such as cigarettes and  e-cigarettes. If you need help quitting, ask your health care provider.  Control any long-term (chronic) conditions you have, such as high cholesterol or diabetes. Monitoring  Monitor your blood pressure at home as told by your health care provider. Your personal target blood pressure may vary depending on your medical conditions, your age, and other factors.  Have your blood pressure checked regularly, as often as told by your health care provider. Working with your health care provider  Review all the medicines you take with your health care provider because there may be side effects or interactions.  Talk with your health care provider about your diet, exercise habits, and other lifestyle factors that may be contributing to hypertension.  Visit your health care provider regularly. Your health care provider can help you create and adjust your plan for managing hypertension. Will I need medicine to control my blood pressure? Your health care provider may prescribe medicine if lifestyle changes are not enough to get your blood pressure under control, and if:  Your systolic blood pressure is 130 or higher.  Your diastolic blood pressure is 80 or higher. Take medicines only as told by your health care provider. Follow the directions carefully. Blood pressure medicines must be taken as prescribed. The medicine does not work as well when you skip doses. Skipping doses also puts you at risk for problems. Contact a health care provider if:  You think you are having a reaction to medicines you have taken.  You have repeated (recurrent) headaches.  You feel dizzy.  You have swelling in your ankles.  You have trouble with your vision. Get help right away if:  You develop a severe headache or confusion.  You have unusual weakness or numbness, or you feel faint.  You have severe pain in your chest or abdomen.  You vomit repeatedly.  You have trouble breathing. Summary  Hypertension  is when the force of blood pumping through your arteries is too strong. If this condition is not controlled, it may put you at risk for serious complications.  Your personal target blood pressure may vary depending on your medical conditions, your age, and other factors. For most people, a normal blood pressure is less than 120/80.  Hypertension is managed by lifestyle changes, medicines, or both. Lifestyle changes include weight loss, eating a healthy, low-sodium diet, exercising more, and limiting alcohol. This information is not intended to replace advice given to you by your health care provider. Make sure you discuss any questions you have with your health care provider. Document Revised: 03/07/2019 Document Reviewed: 10/11/2016 Elsevier Patient Education  2020 [employer].

## 2020-08-17 NOTE — ED Notes (Signed)
Pt given food tray and drink. Given urine cup, instructed pt which bathroom to use and to obtain urine sample. Pt states she understands.

## 2020-08-17 NOTE — ED Notes (Signed)
Pt give apple sauce per request.

## 2020-08-17 NOTE — ED Notes (Signed)
Psych at bedside.

## 2020-08-17 NOTE — ED Provider Notes (Signed)
The patient has been evaluated at bedside by Dr. Lucianne Muss psychiatry.  Patient is clinically stable.  Not felt to be a danger to self or others.  No SI or Hi.  No indication for inpatient psychiatric admission at this time.  Appropriate for continued outpatient therapy.    Willy Eddy, MD 08/17/20 [phone]

## 2020-08-17 NOTE — Consult Note (Signed)
[facility] Psych ED Discharge  08/17/2020 9:00 AM Alexandria Reynolds  MRN:  [id number] Principal Problem: Adjustment disorder with mixed disturbance of emotions and conduct Discharge Diagnoses: Principal Problem:   Adjustment disorder with mixed disturbance of emotions and conduct  Subjective: "I'm good."  Patient seen and evaluated by this provider and Dr Lucianne Muss.  She reports she got upset about not being able to use the phone and acted out.  No suicidal/homicidal ideations, hallucinations, or substance use.  She likes her group home and now wants to return, no longer upset.  Psychiatrically cleared for discharge.  Total Time spent with patient: 45 minutes  Past Psychiatric History: ADHD, bipolar d/o, schizoaffective d/o  Past Medical History:  Past Medical History:  Diagnosis Date  . ADHD (attention deficit hyperactivity disorder)   . Bipolar disorder (HCC)   . Diabetes mellitus, type II (HCC)   . Schizoaffective disorder (HCC)    History reviewed. No pertinent surgical history. Family History:  Family History  Problem Relation Age of Onset  . Drug abuse Mother   . Alcohol abuse Mother   . Alcohol abuse Brother   . Drug abuse Brother   . Alcohol abuse Brother    Family Psychiatric  History: see above Social History:  Social History   Substance and Sexual Activity  Alcohol Use No  . Alcohol/week: 0.0 standard drinks     Social History   Substance and Sexual Activity  Drug Use No    Social History   Socioeconomic History  . Marital status: Single    Spouse name: Not on file  . Number of children: Not on file  . Years of education: Not on file  . Highest education level: Not on file  Occupational History  . Not on file  Tobacco Use  . Smoking status: Current Every Day Smoker    Packs/day: 0.05    Types: Cigarettes    Start date: 02/14/2014  . Smokeless tobacco: Never Used  Vaping Use  . Vaping Use: Never used  Substance and Sexual Activity  . Alcohol use: No     Alcohol/week: 0.0 standard drinks  . Drug use: No  . Sexual activity: Never  Other Topics Concern  . Not on file  Social History Narrative  . Not on file   Social Determinants of Health   Financial Resource Strain:   . Difficulty of Paying Living Expenses: Not on file  Food Insecurity:   . Worried About Programme researcher, broadcasting/film/video in the Last Year: Not on file  . Ran Out of Food in the Last Year: Not on file  Transportation Needs:   . Lack of Transportation (Medical): Not on file  . Lack of Transportation (Non-Medical): Not on file  Physical Activity:   . Days of Exercise per Week: Not on file  . Minutes of Exercise per Session: Not on file  Stress:   . Feeling of Stress : Not on file  Social Connections:   . Frequency of Communication with Friends and Family: Not on file  . Frequency of Social Gatherings with Friends and Family: Not on file  . Attends Religious Services: Not on file  . Active Member of Clubs or Organizations: Not on file  . Attends Banker Meetings: Not on file  . Marital Status: Not on file    Has this patient used any form of tobacco in the last 30 days? (Cigarettes, Smokeless Tobacco, Cigars, and/or Pipes) NA  Current Medications: No current facility-administered medications for  this encounter.   Current Outpatient Medications  Medication Sig Dispense Refill  . chlorproMAZINE (THORAZINE) 100 MG tablet Take 1 tablet (100 mg total) by mouth at bedtime. 30 tablet 0  . darifenacin (ENABLEX) 7.5 MG 24 hr tablet Take 1 tablet (7.5 mg total) by mouth daily. 30 tablet 0  . insulin aspart (NOVOLOG) 100 UNIT/ML injection Inject 12 Units into the skin 3 (three) times daily with meals. 10 mL 0  . insulin detemir (LEVEMIR) 100 UNIT/ML injection Inject 0.23 mLs (23 Units total) into the skin at bedtime. 10 mL 0  . lamoTRIgine (LAMICTAL) 200 MG tablet Take 1 tablet (200 mg total) by mouth daily. 30 tablet 0  . loratadine (CLARITIN) 10 MG tablet Take 1 tablet  (10 mg total) by mouth daily. 30 tablet 0  . LORazepam (ATIVAN) 1 MG tablet Take 1 tablet (1 mg total) by mouth daily as needed. 30 tablet 0  . lurasidone (LATUDA) 80 MG TABS tablet Take 1 tablet (80 mg total) by mouth at bedtime. 30 tablet 0  . metFORMIN (GLUCOPHAGE) 500 MG tablet Take 1 tablet (500 mg total) by mouth 2 (two) times daily with a meal. 60 tablet 0  . metoCLOPramide (REGLAN) 5 MG tablet Take 1 tablet (5 mg total) by mouth 2 (two) times daily. 60 tablet 0  . potassium chloride (KLOR-CON) 10 MEQ tablet Take 1 tablet (10 mEq total) by mouth daily. 7 tablet 0  . traZODone (DESYREL) 100 MG tablet Take 1 tablet (100 mg total) by mouth at bedtime. 30 tablet 0   PTA Medications: (Not in a hospital admission)   Musculoskeletal: Strength & Muscle Tone: within normal limits Gait & Station: normal Patient leans: N/A  Psychiatric Specialty Exam: Physical Exam Vitals and nursing note reviewed.  Constitutional:      Appearance: Normal appearance.  HENT:     Head: Normocephalic.     Nose: Nose normal.  Pulmonary:     Effort: Pulmonary effort is normal.  Musculoskeletal:        General: Normal range of motion.     Cervical back: Normal range of motion.  Neurological:     General: No focal deficit present.     Mental Status: She is alert and oriented to person, place, and time.  Psychiatric:        Attention and Perception: Attention and perception normal.        Mood and Affect: Mood and affect normal.        Speech: Speech normal.        Behavior: Behavior normal.        Thought Content: Thought content normal.        Cognition and Memory: Cognition and memory normal.        Judgment: Judgment is impulsive.     Review of Systems  Psychiatric/Behavioral: Positive for behavioral problems.  All other systems reviewed and are negative.   Blood pressure 119/69, pulse 79, temperature 97.8 F (36.6 C), temperature source Oral, resp. rate 19, height 5\' 7"  (1.702 m), weight  106.1 kg, last menstrual period 08/14/2020, SpO2 98 %.Body mass index is 36.65 kg/m.  General Appearance: Casual  Eye Contact:  Good  Speech:  Normal Rate  Volume:  Normal  Mood:  Euthymic  Affect:  Congruent  Thought Process:  Coherent  Orientation:  Full (Time, Place, and Person)  Thought Content:  WDL and Logical  Suicidal Thoughts:  No  Homicidal Thoughts:  No  Memory:  Immediate;   Good  Recent;   Good Remote;   Good  Judgement:  Fair  Insight:  Fair  Psychomotor Activity:  Normal  Concentration:  Concentration: Good and Attention Span: Good  Recall:  Good  Fund of Knowledge:  Fair  Language:  Good  Akathisia:  No  Handed:  Right  AIMS (if indicated):     Assets:  Communication Skills Leisure Time Physical Health Resilience Social Support  ADL's:  Intact  Cognition:  Impaired,  Mild  Sleep:        Demographic Factors:  Adolescent or young adult  Loss Factors: NA  Historical Factors: Impulsivity  Risk Reduction Factors:   Sense of responsibility to family, Living with another person, especially a relative, Positive social support and Positive therapeutic relationship  Continued Clinical Symptoms:  None  Cognitive Features That Contribute To Risk:  None    Suicide Risk:  Minimal: No identifiable suicidal ideation.  Patients presenting with no risk factors but with morbid ruminations; may be classified as minimal risk based on the severity of the depressive symptoms   Follow-up Information    Hague, Myrene Galas, MD.   Specialty: Internal Medicine Contact information: [address] [place] [address] [phone]        Schedule an appointment as soon as possible for a visit  with [facility]   Contact information: [address] [place] [address] [phone]               Plan Of Care/Follow-up recommendations: Adjustment disorder with mixed disturbance of emotions and conduct: -Continue therapy and  medication management   Schizoaffective disorder, bipolar type -Continue Lamictal 200 mg daily -Continue Thorazine 100 mg daily -Continue Latuda 80 mg daily at bedtime  Insomnia: -Continue Trazodone 100 mg at bedtime daily  Anxiety: -Continue Ativan 1 mg at bedtime PRN Activity:  as tolerated Diet:  heart healthy diet  Disposition: discharge to group home Nanine Means, NP 08/17/2020, 9:00 AM

## 2020-08-17 NOTE — ED Notes (Signed)
Pt verbalized dc instructions and group home waiting for pt in ED waiting room

## 2020-08-17 NOTE — ED Notes (Signed)
Joycelyn Man called and is setting up transportation for pt at this time.
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
## 2020-08-18 ENCOUNTER — Emergency Department
Admission: EM | Admit: 2020-08-18 | Discharge: 2020-08-21 | Disposition: A | Discharge status: Home / Self Care | Payer: Medicare Other | Attending: Emergency Medicine | Admitting: Emergency Medicine

## 2020-08-18 ENCOUNTER — Other Ambulatory Visit: Payer: Self-pay

## 2020-08-18 ENCOUNTER — Encounter: Payer: Self-pay | Admitting: Emergency Medicine

## 2020-08-18 DIAGNOSIS — J45909 Unspecified asthma, uncomplicated: Secondary | ICD-10-CM | POA: Insufficient documentation

## 2020-08-18 DIAGNOSIS — F259 Schizoaffective disorder, unspecified: Secondary | ICD-10-CM | POA: Diagnosis not present

## 2020-08-18 DIAGNOSIS — F4325 Adjustment disorder with mixed disturbance of emotions and conduct: Secondary | ICD-10-CM | POA: Diagnosis not present

## 2020-08-18 DIAGNOSIS — R4689 Other symptoms and signs involving appearance and behavior: Secondary | ICD-10-CM

## 2020-08-18 DIAGNOSIS — Z794 Long term (current) use of insulin: Secondary | ICD-10-CM | POA: Insufficient documentation

## 2020-08-18 DIAGNOSIS — Z79899 Other long term (current) drug therapy: Secondary | ICD-10-CM | POA: Diagnosis not present

## 2020-08-18 DIAGNOSIS — R456 Violent behavior: Secondary | ICD-10-CM | POA: Insufficient documentation

## 2020-08-18 DIAGNOSIS — Z20822 Contact with and (suspected) exposure to covid-19: Secondary | ICD-10-CM | POA: Diagnosis not present

## 2020-08-18 DIAGNOSIS — F17210 Nicotine dependence, cigarettes, uncomplicated: Secondary | ICD-10-CM | POA: Insufficient documentation

## 2020-08-18 DIAGNOSIS — E119 Type 2 diabetes mellitus without complications: Secondary | ICD-10-CM | POA: Diagnosis not present

## 2020-08-18 HISTORY — DX: Unspecified intellectual disabilities: F79

## 2020-08-18 HISTORY — DX: Depression, unspecified: F32.A

## 2020-08-18 LAB — COMPREHENSIVE METABOLIC PANEL
ALT: 21 U/L (ref 0–44)
AST: 27 U/L (ref 15–41)
Albumin: 4.3 g/dL (ref 3.5–5.0)
Alkaline Phosphatase: 45 U/L (ref 38–126)
Anion gap: 11 (ref 5–15)
BUN: 15 mg/dL (ref 6–20)
CO2: 25 mmol/L (ref 22–32)
Calcium: 9.4 mg/dL (ref 8.9–10.3)
Chloride: 102 mmol/L (ref 98–111)
Creatinine, Ser: 0.62 mg/dL (ref 0.44–1.00)
GFR calc Af Amer: >60 mL/min (ref >60)
GFR calc non Af Amer: >60 mL/min (ref >60)
Glucose, Bld: 104 mg/dL — ABNORMAL HIGH (ref 70–99)
Potassium: 3.5 mmol/L (ref 3.5–5.1)
Sodium: 138 mmol/L (ref 135–145)
Total Bilirubin: 0.5 mg/dL (ref 0.3–1.2)
Total Protein: 8.6 g/dL — ABNORMAL HIGH (ref 6.5–8.1)

## 2020-08-18 LAB — URINE DRUG SCREEN, QUALITATIVE (ARMC ONLY)
Amphetamines, Ur Screen: NOT DETECTED
Barbiturates, Ur Screen: NOT DETECTED
Benzodiazepine, Ur Scrn: NOT DETECTED
Cannabinoid 50 Ng, Ur ~~LOC~~: NOT DETECTED
Cocaine Metabolite,Ur ~~LOC~~: NOT DETECTED
MDMA (Ecstasy)Ur Screen: NOT DETECTED
Methadone Scn, Ur: NOT DETECTED
Opiate, Ur Screen: NOT DETECTED
Phencyclidine (PCP) Ur S: NOT DETECTED
Tricyclic, Ur Screen: NOT DETECTED

## 2020-08-18 LAB — SALICYLATE LEVEL: Salicylate Lvl: <7 mg/dL — ABNORMAL LOW (ref 7.0–30.0)

## 2020-08-18 LAB — CBC
HCT: 39.3 % (ref 36.0–46.0)
Hemoglobin: 13 g/dL (ref 12.0–15.0)
MCH: 28 pg (ref 26.0–34.0)
MCHC: 33.1 g/dL (ref 30.0–36.0)
MCV: 84.5 fL (ref 80.0–100.0)
Platelets: 271 10*3/uL (ref 150–400)
RBC: 4.65 MIL/uL (ref 3.87–5.11)
RDW: 13.2 % (ref 11.5–15.5)
WBC: 4.8 10*3/uL (ref 4.0–10.5)
nRBC: 0 % (ref 0.0–0.2)

## 2020-08-18 LAB — ETHANOL: Alcohol, Ethyl (B): <10 mg/dL (ref <10)

## 2020-08-18 LAB — POCT PREGNANCY, URINE: Preg Test, Ur: NEGATIVE

## 2020-08-18 LAB — ACETAMINOPHEN LEVEL: Acetaminophen (Tylenol), Serum: <10 ug/mL — ABNORMAL LOW (ref 10–30)

## 2020-08-18 LAB — SARS CORONAVIRUS 2 BY RT PCR (HOSPITAL ORDER, PERFORMED IN ~~LOC~~ HOSPITAL LAB): SARS Coronavirus 2: NEGATIVE

## 2020-08-18 MED ORDER — BENZTROPINE MESYLATE 1 MG PO TABS: 0.5000 mg | ORAL_TABLET | Freq: Two times a day (BID) | ORAL | Status: DC | PRN

## 2020-08-18 MED ORDER — RISPERIDONE 1 MG PO TABS: 1.0000 mg | ORAL_TABLET | Freq: Every day | ORAL | Status: DC

## 2020-08-18 NOTE — ED Notes (Signed)
Pt given food tray and sprite.

## 2020-08-18 NOTE — ED Notes (Signed)
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
## 2020-08-18 NOTE — ED Notes (Signed)
Hourly rounding reveals patient in room. No complaints, stable, in no acute distress. Q15 minute rounds and monitoring via Rover and Officer to continue.   
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
## 2020-08-18 NOTE — ED Notes (Addendum)
1 cell phone, 1 pair pink crocks, 1 pair white socks, 1 book bag, 1 pair blue shorts, 1 orange T shirt, 1 pink bra.   Pt dressed into blue paper scrubs by this RN and Shawna Orleans, EDT.

## 2020-08-18 NOTE — ED Notes (Signed)
Report to include Situation, Background, Assessment, and Recommendations received from Jeannette RN. Patient alert and oriented, warm and dry, in no acute distress. Patient denies SI, HI, AVH and pain. Patient made aware of Q15 minute rounds and Rover and Officer presence for their safety. Patient instructed to come to me with needs or concerns.   
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
## 2020-08-19 DIAGNOSIS — R456 Violent behavior: Secondary | ICD-10-CM | POA: Diagnosis not present

## 2020-08-19 LAB — GLUCOSE, CAPILLARY
Glucose-Capillary: 133 mg/dL — ABNORMAL HIGH (ref 70–99)
Glucose-Capillary: 133 mg/dL — ABNORMAL HIGH (ref 70–99)
Glucose-Capillary: 157 mg/dL — ABNORMAL HIGH (ref 70–99)

## 2020-08-19 LAB — HEMOGLOBIN A1C
Hgb A1c MFr Bld: 5.9 % — ABNORMAL HIGH (ref 4.8–5.6)
Mean Plasma Glucose: 122.63 mg/dL

## 2020-08-19 MED ORDER — RISPERIDONE 1 MG PO TABS
2.0000 mg | ORAL_TABLET | Freq: Every day | ORAL | Status: DC
  Administered 2020-08-19 – 2020-08-20 (×2): 2 mg via ORAL
  Filled 2020-08-19 (×2): qty 2

## 2020-08-19 MED ORDER — DARIFENACIN HYDROBROMIDE ER 7.5 MG PO TB24
7.5000 mg | ORAL_TABLET | Freq: Every day | ORAL | Status: DC
  Administered 2020-08-20 – 2020-08-21 (×2): 7.5 mg via ORAL
  Filled 2020-08-19 (×3): qty 1

## 2020-08-19 MED ORDER — LORATADINE 10 MG PO TABS
10.0000 mg | ORAL_TABLET | Freq: Every day | ORAL | Status: DC
  Administered 2020-08-19 – 2020-08-21 (×3): 10 mg via ORAL
  Filled 2020-08-19 (×3): qty 1

## 2020-08-19 MED ORDER — METFORMIN HCL 500 MG PO TABS
500.0000 mg | ORAL_TABLET | Freq: Two times a day (BID) | ORAL | Status: DC
  Administered 2020-08-19 – 2020-08-21 (×5): 500 mg via ORAL
  Filled 2020-08-19 (×4): qty 1

## 2020-08-19 MED ORDER — DULOXETINE HCL 20 MG PO CPEP
20.0000 mg | ORAL_CAPSULE | Freq: Every day | ORAL | Status: DC
  Administered 2020-08-19 – 2020-08-21 (×3): 20 mg via ORAL
  Filled 2020-08-19 (×3): qty 1

## 2020-08-19 MED ORDER — INSULIN ASPART 100 UNIT/ML ~~LOC~~ SOLN
0.0000 [IU] | Freq: Three times a day (TID) | SUBCUTANEOUS | Status: DC
  Administered 2020-08-19 (×2): 2 [IU] via SUBCUTANEOUS
  Administered 2020-08-20: 3 [IU] via SUBCUTANEOUS
  Administered 2020-08-20: 2 [IU] via SUBCUTANEOUS
  Administered 2020-08-20 – 2020-08-21 (×2): 3 [IU] via SUBCUTANEOUS
  Administered 2020-08-21: 2 [IU] via SUBCUTANEOUS
  Filled 2020-08-19 (×5): qty 1

## 2020-08-19 MED ORDER — LAMOTRIGINE 100 MG PO TABS
200.0000 mg | ORAL_TABLET | Freq: Every day | ORAL | Status: DC
  Administered 2020-08-19 – 2020-08-21 (×3): 200 mg via ORAL
  Filled 2020-08-19 (×3): qty 2

## 2020-08-19 MED ORDER — INSULIN DETEMIR 100 UNIT/ML ~~LOC~~ SOLN
23.0000 [IU] | Freq: Every day | SUBCUTANEOUS | Status: DC
  Administered 2020-08-19 – 2020-08-20 (×2): 23 [IU] via SUBCUTANEOUS
  Filled 2020-08-19 (×3): qty 0.23

## 2020-08-19 MED ORDER — POTASSIUM CHLORIDE CRYS ER 10 MEQ PO TBCR
10.0000 meq | EXTENDED_RELEASE_TABLET | Freq: Every day | ORAL | Status: DC
  Administered 2020-08-19 – 2020-08-21 (×2): 10 meq via ORAL
  Filled 2020-08-19 (×2): qty 1

## 2020-08-19 MED ORDER — TRAZODONE HCL 100 MG PO TABS
100.0000 mg | ORAL_TABLET | Freq: Every day | ORAL | Status: DC
  Administered 2020-08-19 – 2020-08-20 (×2): 100 mg via ORAL
  Filled 2020-08-19 (×2): qty 1

## 2020-08-19 MED ORDER — METOCLOPRAMIDE HCL 5 MG PO TABS
5.0000 mg | ORAL_TABLET | Freq: Two times a day (BID) | ORAL | Status: DC
  Administered 2020-08-19 – 2020-08-21 (×5): 5 mg via ORAL
  Filled 2020-08-19 (×7): qty 1

## 2020-08-19 MED ORDER — INSULIN ASPART 100 UNIT/ML ~~LOC~~ SOLN
12.0000 [IU] | Freq: Three times a day (TID) | SUBCUTANEOUS | Status: DC
Start: 2020-08-19 — End: 2020-08-19

## 2020-08-19 NOTE — BH Assessment (Signed)
Referral check for Psychiatric Hospitalization faxed:    [facility] ([phone]- [phone]), 12:03 AM Kenard Gower agreed to follow up once referral is reviewed.    [facility] [phone]), 12:13 AM No answer   [facility] [phone]), 12:05 AM No answer   [facility] [phone] -or- [phone]), 11:43 PM Per Durenda Age, pt denied due to IDD diagnosis.    [facility] [phone]). 12:16 AM Left message; requesting a call back.

## 2020-08-19 NOTE — ED Notes (Signed)
Hourly rounding reveals patient awake in day room. No complaints, stable, in no acute distress. Q15 minute rounds and monitoring via Security Cameras to continue. 

## 2020-08-19 NOTE — BH Assessment (Signed)
Referral information for Psychiatric Hospitalization faxed to;   [facility] [phone]),   . [facility] ([phone]),  . [facility] [phone]),   . [facility] [phone] -or- [phone]),   . [facility] [phone]).

## 2020-08-19 NOTE — ED Notes (Signed)
Hourly rounding reveals patient asleep in room. No complaints, stable, in no acute distress. Q15 minute rounds and monitoring via Security Cameras to continue. 

## 2020-08-19 NOTE — BH Assessment (Addendum)
Per Dr. Smith Robert request, TTS attempted to reach out to pt [facility] owner Joycelyn Man [phone]) to gather collateral and left a HIPPA compliant voicemail with Dr. Assunta Gambles contact information as requested.

## 2020-08-19 NOTE — ED Notes (Signed)
Snack and beverage given. 

## 2020-08-19 NOTE — ED Notes (Signed)
Patient's home medications verified by [employer], [place], [place]. Patient is taking Myrbetriq 50 mg once daily, Enablex 7.5 mg has been discontinued.

## 2020-08-19 NOTE — ED Notes (Signed)
Hourly rounding reveals patient awake in room. No complaints, stable, in no acute distress. Q15 minute rounds and monitoring via Security Cameras to continue. 

## 2020-08-19 NOTE — ED Provider Notes (Signed)
Emergency Medicine Observation Re-evaluation Note  Alexandria Reynolds is a 29 y.o. female, seen on rounds today.  Pt initially presented to the ED for complaints of Psychiatric Evaluation Currently, the patient is calm, resting in no distress.  Physical Exam  BP (!) 160/113 (BP Location: Right Arm)   Pulse 82   Temp 98.6 F (37 C) (Oral)   Resp 18   Ht 5\' 7"  (1.702 m)   Wt 106.1 kg   LMP 08/14/2020 (Exact Date)   SpO2 100%   BMI 36.65 kg/m  Physical Exam General: Calm, resting, in no distress Cardiac: Regular rate, skin warm and well perfused Lungs: Normal WOB, no resp distress Psych: Calm, resting  ED Course / MDM  EKG:    I have reviewed the labs performed to date as well as medications administered while in observation.  Recent changes in the last 24 hours include none.  Plan  Current plan is for psychiatric disposition. Patient is under full IVC at this time.   08/16/2020, MD 08/19/20 2012

## 2020-08-19 NOTE — ED Notes (Signed)
IVC  SEEN  BY  DR  RAO  MD  PENDING  PLACEMENT 

## 2020-08-19 NOTE — Consult Note (Signed)
[facility] Face-to-Face Psychiatry Consult   Reason for Consult:  IVC disruptive behaviors at group home  Referring Physician:  ER MD  Patient Identification: [place] MRN:  [id number] Principal Diagnosis: <principal problem not specified> Diagnosis:  Active Problems:   * No active hospital problems. *  Bipolar mixed disorder IED  Adjustment disorder    Total Time spent with patient: 25-30     Subjective:   Alexandria [place] is a 29 y.o. female patient admitted with various disruptive behaviors.  TTS already did assessment see her notes   Group home says they are fearful of her behaviors.  She says she wants to change group homes.  ON IVC pending brief psych admission   Meds adjusted today to help with her symptoms   HPI:   See above    Past Psychiatric History:  Already noted in TTS assessment   Risk to Self: Suicidal Ideation: No Suicidal Intent: No Is patient at risk for suicide?: No Suicidal Plan?: No Access to Means: No What has been your use of drugs/alcohol within the last 12 months?: n/a How many times?: 0 Other Self Harm Risks: Hx of depression Triggers for Past Attempts: None known Intentional Self Injurious Behavior: None Risk to Others: Homicidal Ideation: No Thoughts of Harm to Others: No Current Homicidal Intent: No-Not Currently/Within Last 6 Months Current Homicidal Plan: No Access to Homicidal Means: No Identified Victim: n/a History of harm to others?: No Assessment of Violence: In past 6-12 months Violent Behavior Description: Pt has been verbally/physically aggressive towards group home staff/residents Does patient have access to weapons?: No Criminal Charges Pending?: No Does patient have a court date: No Prior Inpatient Therapy: Prior Inpatient Therapy: Yes Prior Therapy Dates: 06/2020 Prior Therapy Facilty/Provider(s): [facility] Reason for Treatment: Depression Prior Outpatient Therapy: Prior Outpatient Therapy: Yes Prior Therapy Dates:   (Unable to recall) Prior Therapy Facilty/Provider(s):  (Unable to recall) Reason for Treatment: Bipolar Does patient have an ACCT team?: No Does patient have Intensive In-House Services?  : No Does patient have [facility] services? : No Does patient have P4CC services?: No  Past Medical History:  Past Medical History:  Diagnosis Date  . ADHD (attention deficit hyperactivity disorder)   . Bipolar disorder (HCC)   . Depression   . Diabetes mellitus, type II (HCC)   . Disorder of intellectual development   . Schizoaffective disorder (HCC)    No past surgical history on file. Family History:  Family History  Problem Relation Age of Onset  . Drug abuse Mother   . Alcohol abuse Mother   . Alcohol abuse Brother   . Drug abuse Brother   . Alcohol abuse Brother    Family Psychiatric  History:  See TTS  Social History:  Social History   Substance and Sexual Activity  Alcohol Use No  . Alcohol/week: 0.0 standard drinks     Social History   Substance and Sexual Activity  Drug Use No    Social History   Socioeconomic History  . Marital status: Single    Spouse name: Not on file  . Number of children: Not on file  . Years of education: Not on file  . Highest education level: Not on file  Occupational History  . Not on file  Tobacco Use  . Smoking status: Current Every Day Smoker    Packs/day: 0.05    Types: Cigarettes    Start date: 02/14/2014  . Smokeless tobacco: Never Used  Vaping Use  . Vaping  Use: Never used  Substance and Sexual Activity  . Alcohol use: No    Alcohol/week: 0.0 standard drinks  . Drug use: No  . Sexual activity: Never  Other Topics Concern  . Not on file  Social History Narrative  . Not on file   Social Determinants of Health   Financial Resource Strain:   . Difficulty of Paying Living Expenses: Not on file  Food Insecurity:   . Worried About Programme researcher, broadcasting/film/video in the Last Year: Not on file  . Ran Out of Food in the Last Year: Not on file   Transportation Needs:   . Lack of Transportation (Medical): Not on file  . Lack of Transportation (Non-Medical): Not on file  Physical Activity:   . Days of Exercise per Week: Not on file  . Minutes of Exercise per Session: Not on file  Stress:   . Feeling of Stress : Not on file  Social Connections:   . Frequency of Communication with Friends and Family: Not on file  . Frequency of Social Gatherings with Friends and Family: Not on file  . Attends Religious Services: Not on file  . Active Member of Clubs or Organizations: Not on file  . Attends Banker Meetings: Not on file  . Marital Status: Not on file   Additional Social History:    Allergies:   Allergies  Allergen Reactions  . Penicillins Rash    Did it involve swelling of the face/tongue/throat, SOB, or low BP? No Did it involve sudden or severe rash/hives, skin peeling, or any reaction on the inside of your mouth or nose? Yes Did you need to seek medical attention at a hospital or doctor's office? No When did it last happen? If all above answers are "NO", may proceed with cephalosporin use.    Labs:  Results for orders placed or performed during the hospital encounter of 08/18/20 (from the past 48 hour(s))  Comprehensive metabolic panel     Status: Abnormal   Collection Time: 08/18/20  4:24 PM  Result Value Ref Range   Sodium 138 135 - 145 mmol/L   Potassium 3.5 3.5 - 5.1 mmol/L   Chloride 102 98 - 111 mmol/L   CO2 25 22 - 32 mmol/L   Glucose, Bld 104 (H) 70 - 99 mg/dL    Comment: Glucose reference range applies only to samples taken after fasting for at least 8 hours.   BUN 15 6 - 20 mg/dL   Creatinine, Ser 0.34 0.44 - 1.00 mg/dL   Calcium 9.4 8.9 - 74.2 mg/dL   Total Protein 8.6 (H) 6.5 - 8.1 g/dL   Albumin 4.3 3.5 - 5.0 g/dL   AST 27 15 - 41 U/L   ALT 21 0 - 44 U/L   Alkaline Phosphatase 45 38 - 126 U/L   Total Bilirubin 0.5 0.3 - 1.2 mg/dL   GFR calc non Af Amer >60 >60 mL/min   GFR  calc Af Amer >60 >60 mL/min   Anion gap 11 5 - 15    Comment: Performed at [facility], [address] [place], [place] [address]  Ethanol     Status: None   Collection Time: 08/18/20  4:24 PM  Result Value Ref Range   Alcohol, Ethyl (B) <10 <10 mg/dL    Comment: (NOTE) Lowest detectable limit for serum alcohol is 10 mg/dL.  For medical purposes only. Performed at [facility], [address], [place], [place] [address]  Salicylate level     Status: Abnormal   Collection Time: 08/18/20  4:24 PM  Result Value Ref Range   Salicylate Lvl <7.0 (L) 7.0 - 30.0 mg/dL    Comment: Performed at [facility], [address] [place], [place] [address]  Acetaminophen level     Status: Abnormal   Collection Time: 08/18/20  4:24 PM  Result Value Ref Range   Acetaminophen (Tylenol), Serum <10 (L) 10 - 30 ug/mL    Comment: (NOTE) Therapeutic concentrations vary significantly. A range of 10-30 ug/mL  may be an effective concentration for many patients. However, some  are best treated at concentrations outside of this range. Acetaminophen concentrations >150 ug/mL at 4 hours after ingestion  and >50 ug/mL at 12 hours after ingestion are often associated with  toxic reactions.  Performed at [facility], [address] [place], [place] [address]   cbc     Status: None   Collection Time: 08/18/20  4:24 PM  Result Value Ref Range   WBC 4.8 4.0 - 10.5 K/uL   RBC 4.65 3.87 - 5.11 MIL/uL   Hemoglobin 13.0 12.0 - 15.0 g/dL   HCT 09.8 36 - 46 %   MCV 84.5 80.0 - 100.0 fL   MCH 28.0 26.0 - 34.0 pg   MCHC 33.1 30.0 - 36.0 g/dL   RDW 11.9 14.7 - 82.9 %   Platelets 271 150 - 400 K/uL   nRBC 0.0 0.0 - 0.2 %    Comment: Performed at [facility], [address] [place], [place] [address]  Urine Drug Screen, Qualitative     Status: None   Collection Time: 08/18/20  6:24 PM  Result Value Ref Range   Tricyclic, Ur Screen NONE DETECTED NONE  DETECTED   Amphetamines, Ur Screen NONE DETECTED NONE DETECTED   MDMA (Ecstasy)Ur Screen NONE DETECTED NONE DETECTED   Cocaine Metabolite,Ur [place] NONE DETECTED NONE DETECTED   Opiate, Ur Screen NONE DETECTED NONE DETECTED   Phencyclidine (PCP) Ur S NONE DETECTED NONE DETECTED   Cannabinoid 50 Ng, Ur [place] NONE DETECTED NONE DETECTED   Barbiturates, Ur Screen NONE DETECTED NONE DETECTED   Benzodiazepine, Ur Scrn NONE DETECTED NONE DETECTED   Methadone Scn, Ur NONE DETECTED NONE DETECTED    Comment: (NOTE) Tricyclics + metabolites, urine    Cutoff 1000 ng/mL Amphetamines + metabolites, urine  Cutoff 1000 ng/mL MDMA (Ecstasy), urine              Cutoff 500 ng/mL Cocaine Metabolite, urine          Cutoff 300 ng/mL Opiate + metabolites, urine        Cutoff 300 ng/mL Phencyclidine (PCP), urine         Cutoff 25 ng/mL Cannabinoid, urine                 Cutoff 50 ng/mL Barbiturates + metabolites, urine  Cutoff 200 ng/mL Benzodiazepine, urine              Cutoff 200 ng/mL Methadone, urine                   Cutoff 300 ng/mL  The urine drug screen provides only a preliminary, unconfirmed analytical test result and should not be used for non-medical purposes. Clinical consideration and professional judgment should be applied to any positive drug screen result due to possible interfering substances. A more specific alternate chemical method must be used in order to obtain a confirmed analytical  result. Gas chromatography / mass spectrometry (GC/MS) is the preferred confirm atory method. Performed at [facility], [address] [place], [place] [address]   Pregnancy, urine POC     Status: None   Collection Time: 08/18/20  7:09 PM  Result Value Ref Range   Preg Test, Ur NEGATIVE NEGATIVE    Comment:        THE SENSITIVITY OF THIS METHODOLOGY IS >24 mIU/mL   SARS Coronavirus 2 by RT PCR (hospital order, performed in [facility] hospital lab) Nasopharyngeal Nasopharyngeal Swab      Status: None   Collection Time: 08/18/20  7:21 PM   Specimen: Nasopharyngeal Swab  Result Value Ref Range   SARS Coronavirus 2 NEGATIVE NEGATIVE    Comment: (NOTE) SARS-CoV-2 target nucleic acids are NOT DETECTED.  The SARS-CoV-2 RNA is generally detectable in upper and lower respiratory specimens during the acute phase of infection. The lowest concentration of SARS-CoV-2 viral copies this assay can detect is 250 copies / mL. A negative result does not preclude SARS-CoV-2 infection and should not be used as the sole basis for treatment or other patient management decisions.  A negative result may occur with improper specimen collection / handling, submission of specimen other than nasopharyngeal swab, presence of viral mutation(s) within the areas targeted by this assay, and inadequate number of viral copies (<250 copies / mL). A negative result must be combined with clinical observations, patient history, and epidemiological information.  Fact Sheet for Patients:   BoilerBrush.com.cy  Fact Sheet for Healthcare Providers: https://pope.com/  This test is not yet approved or  cleared by the [place] FDA and has been authorized for detection and/or diagnosis of SARS-CoV-2 by FDA under an Emergency Use Authorization (EUA).  This EUA will remain in effect (meaning this test can be used) for the duration of the COVID-19 declaration under Section 564(b)(1) of the Act, 21 U.S.C. section 360bbb-3(b)(1), unless the authorization is terminated or revoked sooner.  Performed at [facility], [address] [place], [place] [address]   Glucose, capillary     Status: Abnormal   Collection Time: 08/19/20  1:40 PM  Result Value Ref Range   Glucose-Capillary 133 (H) 70 - 99 mg/dL    Comment: Glucose reference range applies only to samples taken after fasting for at least 8 hours.    Current Facility-Administered Medications   Medication Dose Route Frequency Provider Last Rate Last Admin  . benztropine (COGENTIN) tablet 0.5 mg  0.5 mg Oral BID PRN Nira Conn A, NP      . darifenacin (ENABLEX) 24 hr tablet 7.5 mg  7.5 mg Oral Daily Shaune Pollack, MD      . DULoxetine (CYMBALTA) DR capsule 20 mg  20 mg Oral Daily Roselind Messier, MD      . insulin aspart (novoLOG) injection 0-15 Units  0-15 Units Subcutaneous TID WC Shaune Pollack, MD   2 Units at 08/19/20 1402  . insulin detemir (LEVEMIR) injection 23 Units  23 Units Subcutaneous QHS Shaune Pollack, MD      . lamoTRIgine (LAMICTAL) tablet 200 mg  200 mg Oral Daily Shaune Pollack, MD   200 mg at 08/19/20 1332  . loratadine (CLARITIN) tablet 10 mg  10 mg Oral Daily Shaune Pollack, MD   10 mg at 08/19/20 1332  . metFORMIN (GLUCOPHAGE) tablet 500 mg  500 mg Oral BID WC Shaune Pollack, MD   500 mg at 08/19/20 1333  . metoCLOPramide (REGLAN) tablet 5 mg  5  mg Oral BID Shaune Pollack, MD   5 mg at 08/19/20 1333  . potassium chloride (KLOR-CON) CR tablet 10 mEq  10 mEq Oral Daily Shaune Pollack, MD   10 mEq at 08/19/20 1333  . risperiDONE (RISPERDAL) tablet 2 mg  2 mg Oral QHS Roselind Messier, MD      . traZODone (DESYREL) tablet 100 mg  100 mg Oral QHS Shaune Pollack, MD       Current Outpatient Medications  Medication Sig Dispense Refill  . insulin aspart (NOVOLOG) 100 UNIT/ML injection Inject 12 Units into the skin 3 (three) times daily with meals. 10 mL 0  . lamoTRIgine (LAMICTAL) 200 MG tablet Take 1 tablet (200 mg total) by mouth daily. 30 tablet 0  . loratadine (CLARITIN) 10 MG tablet Take 1 tablet (10 mg total) by mouth daily. 30 tablet 0  . LORazepam (ATIVAN) 1 MG tablet Take 1 tablet (1 mg total) by mouth daily as needed. 30 tablet 0  . metFORMIN (GLUCOPHAGE) 500 MG tablet Take 1 tablet (500 mg total) by mouth 2 (two) times daily with a meal. 60 tablet 0  . MYRBETRIQ 50 MG TB24 tablet Take 50 mg by mouth daily.    . potassium chloride (KLOR-CON) 10  MEQ tablet Take 1 tablet (10 mEq total) by mouth daily. 7 tablet 0  . chlorproMAZINE (THORAZINE) 100 MG tablet Take 1 tablet (100 mg total) by mouth at bedtime. 30 tablet 0  . darifenacin (ENABLEX) 7.5 MG 24 hr tablet Take 1 tablet (7.5 mg total) by mouth daily. (Patient not taking: Reported on 08/19/2020) 30 tablet 0  . insulin detemir (LEVEMIR) 100 UNIT/ML injection Inject 0.23 mLs (23 Units total) into the skin at bedtime. 10 mL 0  . lurasidone (LATUDA) 80 MG TABS tablet Take 1 tablet (80 mg total) by mouth at bedtime. 30 tablet 0  . metoCLOPramide (REGLAN) 10 MG tablet Take 10 mg by mouth at bedtime.    . traZODone (DESYREL) 100 MG tablet Take 1 tablet (100 mg total) by mouth at bedtime. 30 tablet 0    Musculoskeletal: Strength & Muscle Tone: normal  Gait & Station: normal  Patient leans: normal   Psychiatric Specialty Exam: Physical Exam  Review of Systems  Blood pressure (!) 140/111, pulse (!) 56, temperature 97.7 F (36.5 C), temperature source Oral, resp. rate 14, height 5\' 7"  (1.702 m), weight 106.1 kg, last menstrual period 08/14/2020, SpO2 98 %.Body mass index is 36.65 kg/m.    Mental Status   AA female at rest  Appearance normal eye contact okay  Rapport normal Mood and affect slightly anxious Consciousness not clouded or fluctuant Concentration and attention okay No shakes tics tremors Memory remote recent immediate intact  Speech normal rate tone volume fluency Thought process and content no frank psychosis or mania SI ---none HI none  Patient though admits she has violent triggers at group home and with staff and she understands it places her at risk  Abstraction normal Judgment insight fair Reliability fair  Intelligence fund of knowledge fair to below average   Handedness not known Akathisia none Recall normal Psychomotor normal Assets --caring guardian ADL's normal  Cognition normal  Sleep normal  Aims not done          Treatment Plan  Summary:   AA female awaits bed on IVC for OOC and IED behaviors group home does not feel safe   Patient recognizes her issues and admits to increasing depression and anxiety as well.  Risperdal increased to 2mg  po qhs Cymbalta added 20 mg po qam      Disposition:  Awaits psych bed   From there she and guardian can negotiate what to do with this or next group home.  Roselind Messieramakrishna Kesha Hurrell, MD 08/19/2020 4:07 PM

## 2020-08-19 NOTE — ED Notes (Signed)
Pt. Was given a snack and drink. 

## 2020-08-19 NOTE — ED Notes (Signed)
Report to include Situation, Background, Assessment, and Recommendations received from Amy RN. Patient alert and oriented, warm and dry, in no acute distress. Patient denies SI, HI, AVH and pain. Patient made aware of Q15 minute rounds and security cameras for their safety. Patient instructed to come to this nurse with needs or concerns.

## 2020-08-20 DIAGNOSIS — R456 Violent behavior: Secondary | ICD-10-CM | POA: Diagnosis not present

## 2020-08-20 LAB — GLUCOSE, CAPILLARY
Glucose-Capillary: 127 mg/dL — ABNORMAL HIGH (ref 70–99)
Glucose-Capillary: 156 mg/dL — ABNORMAL HIGH (ref 70–99)
Glucose-Capillary: 181 mg/dL — ABNORMAL HIGH (ref 70–99)
Glucose-Capillary: 183 mg/dL — ABNORMAL HIGH (ref 70–99)

## 2020-08-20 NOTE — ED Notes (Signed)
Hourly rounding reveals patient asleep in room. No complaints, stable, in no acute distress. Q15 minute rounds and monitoring via Security Cameras to continue. 

## 2020-08-20 NOTE — ED Notes (Signed)
Pt provided snack.

## 2020-08-20 NOTE — ED Notes (Signed)
IVC/  PENDING  PLACEMENT 

## 2020-08-20 NOTE — ED Provider Notes (Signed)
Emergency Medicine Observation Re-evaluation Note  Alexandria [place] is a 29 y.o. female, seen on rounds today.    Physical Exam  BP (!) 163/100 (BP Location: Right Arm)   Pulse 67   Temp 98.7 F (37.1 C) (Oral)   Resp 20   Ht 5\' 7"  (1.702 m)   Wt 106.1 kg   LMP 08/14/2020 (Exact Date)   SpO2 100%   BMI 36.65 kg/m  Physical Exam General:  patient resting comfortably in bed Lungs: Patient in no respiratory distress Psych: Patient calm and not confrontational  ED Course / MDM  EKG:    I have reviewed the labs performed to date   Plan  Patient disposition per psychiatry   08/16/2020, MD 08/20/20 [phone]

## 2020-08-21 DIAGNOSIS — F4325 Adjustment disorder with mixed disturbance of emotions and conduct: Secondary | ICD-10-CM | POA: Diagnosis not present

## 2020-08-21 DIAGNOSIS — R456 Violent behavior: Secondary | ICD-10-CM | POA: Diagnosis not present

## 2020-08-21 LAB — GLUCOSE, CAPILLARY
Glucose-Capillary: 112 mg/dL — ABNORMAL HIGH (ref 70–99)
Glucose-Capillary: 174 mg/dL — ABNORMAL HIGH (ref 70–99)
Glucose-Capillary: 150 mg/dL — ABNORMAL HIGH (ref 70–99)

## 2020-08-21 MED ORDER — DULOXETINE HCL 20 MG PO CPEP
20.0000 mg | ORAL_CAPSULE | Freq: Every day | ORAL | 3 refills | Status: DC
Start: 2020-08-22 — End: 2022-05-06

## 2020-08-21 MED ORDER — BENZTROPINE MESYLATE 0.5 MG PO TABS
0.5000 mg | ORAL_TABLET | Freq: Two times a day (BID) | ORAL | 1 refills | Status: DC | PRN
Start: 2020-08-21 — End: 2022-05-06

## 2020-08-21 MED ORDER — RISPERIDONE 2 MG PO TABS: 2.0000 mg | ORAL_TABLET | Freq: Every day | ORAL | 1 refills | Status: AC

## 2020-08-21 NOTE — Discharge Instructions (Signed)
Discharge to her group home

## 2020-08-21 NOTE — BH Assessment (Signed)
Referral check for Psychiatric Hospitalization faxed:   [facility] ([phone]- [phone]), Denied due to no insurance    [facility] [phone]), 12:13 AM No answer   [facility] [phone]), No answer   [facility] [phone] -or- [phone]),  Per Durenda Age, pt denied due to IDD diagnosis.    [facility] [phone]). 12:16 AM Left message; requesting a call back.

## 2020-08-21 NOTE — ED Notes (Signed)
Rescinded by Nanine Means

## 2020-08-21 NOTE — ED Notes (Signed)
Spoke with Ms. Alexandria [place] will be picking patient up at 1600

## 2020-08-21 NOTE — Consult Note (Signed)
[facility] ED Discharge  08/21/2020 12:18 PM Alexandria Reynolds  MRN:  [id number] Principal Problem: Adjustment disorder with mixed disturbance of emotions and conduct Discharge Diagnoses: Principal Problem:   Adjustment disorder with mixed disturbance of emotions and conduct  Subjective: "I'm ready to go."  Patient seen and evaluated in person by this provider and [facility] Specialist, Jasmine.  He has been calm and cooperative for the few days with no behavior issues.  Medications were adjusted with stabilization.  She got upset at her group home and became aggressive.  Her guardian was contacted and had no issues with her returning to her group home.  No suicidal/homicidal ideations, hallucinations, or substance use.  Psychiatrically stable for discharge.  Dr Lucianne Muss reviewed this client and concurs with the plan.  Total Time spent with patient: 45 minutes  Past Psychiatric History: ADHD, schizoaffective d/o  Past Medical History:  Past Medical History:  Diagnosis Date  . ADHD (attention deficit hyperactivity disorder)   . Bipolar disorder (HCC)   . Depression   . Diabetes mellitus, type II (HCC)   . Disorder of intellectual development   . Schizoaffective disorder (HCC)    No past surgical history on file. Family History:  Family History  Problem Relation Age of Onset  . Drug abuse Mother   . Alcohol abuse Mother   . Alcohol abuse Brother   . Drug abuse Brother   . Alcohol abuse Brother    Family Psychiatric  History: see above Social History:  Social History   Substance and Sexual Activity  Alcohol Use No  . Alcohol/week: 0.0 standard drinks     Social History   Substance and Sexual Activity  Drug Use No    Social History   Socioeconomic History  . Marital status: Single    Spouse name: Not on file  . Number of children: Not on file  . Years of education: Not on file  . Highest education level: Not on file  Occupational History  . Not on file  Tobacco Use  .  Smoking status: Current Every Day Smoker    Packs/day: 0.05    Types: Cigarettes    Start date: 02/14/2014  . Smokeless tobacco: Never Used  Vaping Use  . Vaping Use: Never used  Substance and Sexual Activity  . Alcohol use: No    Alcohol/week: 0.0 standard drinks  . Drug use: No  . Sexual activity: Never  Other Topics Concern  . Not on file  Social History Narrative  . Not on file   Social Determinants of Health   Financial Resource Strain:   . Difficulty of Paying Living Expenses: Not on file  Food Insecurity:   . Worried About Programme researcher, broadcasting/film/video in the Last Year: Not on file  . Ran Out of Food in the Last Year: Not on file  Transportation Needs:   . Lack of Transportation (Medical): Not on file  . Lack of Transportation (Non-Medical): Not on file  Physical Activity:   . Days of Exercise per Week: Not on file  . Minutes of Exercise per Session: Not on file  Stress:   . Feeling of Stress : Not on file  Social Connections:   . Frequency of Communication with Friends and Family: Not on file  . Frequency of Social Gatherings with Friends and Family: Not on file  . Attends Religious Services: Not on file  . Active Member of Clubs or Organizations: Not on file  . Attends Banker  Meetings: Not on file  . Marital Status: Not on file    Has this patient used any form of tobacco in the last 30 days? (Cigarettes, Smokeless Tobacco, Cigars, and/or Pipes) A prescription for an FDA-approved tobacco cessation medication was offered at discharge and the patient refused  Current Medications: Current Facility-Administered Medications  Medication Dose Route Frequency Provider Last Rate Last Admin  . benztropine (COGENTIN) tablet 0.5 mg  0.5 mg Oral BID PRN Nira Conn A, NP      . darifenacin (ENABLEX) 24 hr tablet 7.5 mg  7.5 mg Oral Daily Shaune Pollack, MD   7.5 mg at 08/21/20 1027  . DULoxetine (CYMBALTA) DR capsule 20 mg  20 mg Oral Daily Roselind Messier, MD   20  mg at 08/21/20 1027  . insulin aspart (novoLOG) injection 0-15 Units  0-15 Units Subcutaneous TID WC Shaune Pollack, MD   3 Units at 08/20/20 1823  . insulin detemir (LEVEMIR) injection 23 Units  23 Units Subcutaneous QHS Shaune Pollack, MD   23 Units at 08/20/20 2316  . lamoTRIgine (LAMICTAL) tablet 200 mg  200 mg Oral Daily Shaune Pollack, MD   200 mg at 08/21/20 1027  . loratadine (CLARITIN) tablet 10 mg  10 mg Oral Daily Shaune Pollack, MD   10 mg at 08/21/20 1027  . metFORMIN (GLUCOPHAGE) tablet 500 mg  500 mg Oral BID WC Shaune Pollack, MD   500 mg at 08/21/20 1031  . metoCLOPramide (REGLAN) tablet 5 mg  5 mg Oral BID Shaune Pollack, MD   5 mg at 08/21/20 1028  . potassium chloride (KLOR-CON) CR tablet 10 mEq  10 mEq Oral Daily Shaune Pollack, MD   10 mEq at 08/21/20 1028  . risperiDONE (RISPERDAL) tablet 2 mg  2 mg Oral QHS Roselind Messier, MD   2 mg at 08/20/20 2315  . traZODone (DESYREL) tablet 100 mg  100 mg Oral QHS Shaune Pollack, MD   100 mg at 08/20/20 2315   Current Outpatient Medications  Medication Sig Dispense Refill  . insulin aspart (NOVOLOG) 100 UNIT/ML injection Inject 12 Units into the skin 3 (three) times daily with meals. 10 mL 0  . lamoTRIgine (LAMICTAL) 200 MG tablet Take 1 tablet (200 mg total) by mouth daily. 30 tablet 0  . loratadine (CLARITIN) 10 MG tablet Take 1 tablet (10 mg total) by mouth daily. 30 tablet 0  . LORazepam (ATIVAN) 1 MG tablet Take 1 tablet (1 mg total) by mouth daily as needed. 30 tablet 0  . metFORMIN (GLUCOPHAGE) 500 MG tablet Take 1 tablet (500 mg total) by mouth 2 (two) times daily with a meal. 60 tablet 0  . MYRBETRIQ 50 MG TB24 tablet Take 50 mg by mouth daily.    . potassium chloride (KLOR-CON) 10 MEQ tablet Take 1 tablet (10 mEq total) by mouth daily. 7 tablet 0  . chlorproMAZINE (THORAZINE) 100 MG tablet Take 1 tablet (100 mg total) by mouth at bedtime. 30 tablet 0  . darifenacin (ENABLEX) 7.5 MG 24 hr tablet Take 1 tablet (7.5  mg total) by mouth daily. (Patient not taking: Reported on 08/19/2020) 30 tablet 0  . insulin detemir (LEVEMIR) 100 UNIT/ML injection Inject 0.23 mLs (23 Units total) into the skin at bedtime. 10 mL 0  . lurasidone (LATUDA) 80 MG TABS tablet Take 1 tablet (80 mg total) by mouth at bedtime. 30 tablet 0  . metoCLOPramide (REGLAN) 10 MG tablet Take 10 mg by mouth at bedtime.    . traZODone (  DESYREL) 100 MG tablet Take 1 tablet (100 mg total) by mouth at bedtime. 30 tablet 0   PTA Medications: (Not in a hospital admission)   Musculoskeletal: Strength & Muscle Tone: within normal limits Gait & Station: normal Patient leans: N/A  Psychiatric Specialty Exam: Physical Exam Vitals and nursing note reviewed.  Constitutional:      Appearance: Normal appearance.  HENT:     Head: Normocephalic.     Nose: Nose normal.  Pulmonary:     Effort: Pulmonary effort is normal.  Musculoskeletal:        General: Normal range of motion.     Cervical back: Normal range of motion.  Neurological:     General: No focal deficit present.     Mental Status: She is alert and oriented to person, place, and time.  Psychiatric:        Attention and Perception: Attention and perception normal.        Mood and Affect: Mood is anxious.        Speech: Speech normal.        Behavior: Behavior normal. Behavior is cooperative.        Thought Content: Thought content normal.        Cognition and Memory: Cognition is impaired.        Judgment: Judgment normal.     Review of Systems  Psychiatric/Behavioral: The patient is nervous/anxious.   All other systems reviewed and are negative.   Blood pressure (!) 141/94, pulse 78, temperature 97.8 F (36.6 C), temperature source Oral, resp. rate 18, height 5\' 7"  (1.702 m), weight 106.1 kg, last menstrual period 08/14/2020, SpO2 98 %.Body mass index is 36.65 kg/m.  General Appearance: Casual  Eye Contact:  Good  Speech:  Normal Rate  Volume:  Normal  Mood:  Anxious   Affect:  Congruent  Thought Process:  Coherent and Descriptions of Associations: Intact  Orientation:  Full (Time, Place, and Person)  Thought Content:  WDL and Logical  Suicidal Thoughts:  No  Homicidal Thoughts:  No  Memory:  Immediate;   Good Recent;   Good Remote;   Good  Judgement:  Fair  Insight:  Fair  Psychomotor Activity:  Normal  Concentration:  Concentration: Good and Attention Span: Good  Recall:  Good  Fund of Knowledge:  Fair  Language:  Good  Akathisia:  No  Handed:  Right  AIMS (if indicated):     Assets:  Housing Leisure Time Physical Health Resilience Social Support  ADL's:  Intact  Cognition:  Impaired,  Mild  Sleep:        Demographic Factors:  NA  Loss Factors: NA  Historical Factors: Impulsivity  Risk Reduction Factors:   Sense of responsibility to family, Living with another person, especially a relative, Positive social support and Positive therapeutic relationship  Continued Clinical Symptoms:  Anxiety, mild  Cognitive Features That Contribute To Risk:  None    Suicide Risk:  Minimal: No identifiable suicidal ideation.  Patients presenting with no risk factors but with morbid ruminations; may be classified as minimal risk based on the severity of the depressive symptoms    Plan Of Care/Follow-up recommendations:  Schizoaffective disorder, bipolar type: -Continue Lamictal 200 mg daily -Continue Cymbalta 20 mg daily -Continue Risperdal 2 mg at bedtime  EPS: -Continue Cogentin 0.5 mg BID PRN  Insomnia: -Continue Trazodone 100 mg at bedtime  Activity:  as tolerated Diet:  heart healthy diet  Disposition: discharge to group home 08/16/2020, NP 08/21/2020,  12:18 PM

## 2020-08-21 NOTE — BH Assessment (Addendum)
Writer spoke with the patient to complete an updated/reassessment. Patient presented with a pleasant mood and a bright affect. Pt denies SI/HI and AV/H. Pt expressed a readiness to discharge back to her group home. Per Haig Prophet, NP patient no longer meet inpatient criteria.  12:15 Writer attempted to reach group home [facility] [phone] multiple times to update them about pt's upcoming discharge and to arrange transportation. Staff explained that they would call back once staff are able to pick pt up.

## 2021-04-18 ENCOUNTER — Emergency Department (HOSPITAL_COMMUNITY)
Admission: EM | Admit: 2021-04-18 | Discharge: 2021-04-19 | Disposition: A | Discharge status: Home / Self Care | Payer: Medicare Other | Attending: Emergency Medicine | Admitting: Emergency Medicine

## 2021-04-18 ENCOUNTER — Emergency Department (HOSPITAL_COMMUNITY): Payer: Medicare Other

## 2021-04-18 ENCOUNTER — Encounter (HOSPITAL_COMMUNITY): Payer: Self-pay | Admitting: Emergency Medicine

## 2021-04-18 DIAGNOSIS — F319 Bipolar disorder, unspecified: Secondary | ICD-10-CM | POA: Diagnosis present

## 2021-04-18 DIAGNOSIS — F69 Unspecified disorder of adult personality and behavior: Secondary | ICD-10-CM | POA: Diagnosis not present

## 2021-04-18 DIAGNOSIS — E119 Type 2 diabetes mellitus without complications: Secondary | ICD-10-CM | POA: Diagnosis not present

## 2021-04-18 DIAGNOSIS — F333 Major depressive disorder, recurrent, severe with psychotic symptoms: Secondary | ICD-10-CM | POA: Diagnosis present

## 2021-04-18 DIAGNOSIS — J45909 Unspecified asthma, uncomplicated: Secondary | ICD-10-CM | POA: Diagnosis not present

## 2021-04-18 DIAGNOSIS — Z20822 Contact with and (suspected) exposure to covid-19: Secondary | ICD-10-CM | POA: Diagnosis not present

## 2021-04-18 DIAGNOSIS — Z794 Long term (current) use of insulin: Secondary | ICD-10-CM | POA: Diagnosis not present

## 2021-04-18 DIAGNOSIS — Z7984 Long term (current) use of oral hypoglycemic drugs: Secondary | ICD-10-CM | POA: Diagnosis not present

## 2021-04-18 DIAGNOSIS — Y900 Blood alcohol level of less than 20 mg/100 ml: Secondary | ICD-10-CM | POA: Insufficient documentation

## 2021-04-18 DIAGNOSIS — F17210 Nicotine dependence, cigarettes, uncomplicated: Secondary | ICD-10-CM | POA: Insufficient documentation

## 2021-04-18 LAB — SALICYLATE LEVEL: Salicylate Lvl: <7 mg/dL — ABNORMAL LOW (ref 7.0–30.0)

## 2021-04-18 LAB — COMPREHENSIVE METABOLIC PANEL
ALT: 20 U/L (ref 0–44)
AST: 18 U/L (ref 15–41)
Albumin: 4.1 g/dL (ref 3.5–5.0)
Alkaline Phosphatase: 44 U/L (ref 38–126)
Anion gap: 6 (ref 5–15)
BUN: 16 mg/dL (ref 6–20)
CO2: 23 mmol/L (ref 22–32)
Calcium: 9 mg/dL (ref 8.9–10.3)
Chloride: 105 mmol/L (ref 98–111)
Creatinine, Ser: 0.73 mg/dL (ref 0.44–1.00)
GFR, Estimated: >60 mL/min (ref >60)
Glucose, Bld: 301 mg/dL — ABNORMAL HIGH (ref 70–99)
Potassium: 3.8 mmol/L (ref 3.5–5.1)
Sodium: 134 mmol/L — ABNORMAL LOW (ref 135–145)
Total Bilirubin: 0.4 mg/dL (ref 0.3–1.2)
Total Protein: 8.6 g/dL — ABNORMAL HIGH (ref 6.5–8.1)

## 2021-04-18 LAB — RAPID URINE DRUG SCREEN, HOSP PERFORMED
Amphetamines: NOT DETECTED
Barbiturates: NOT DETECTED
Benzodiazepines: NOT DETECTED
Cocaine: NOT DETECTED
Opiates: NOT DETECTED
Tetrahydrocannabinol: NOT DETECTED

## 2021-04-18 LAB — CBC
HCT: 38.1 % (ref 36.0–46.0)
Hemoglobin: 12.3 g/dL (ref 12.0–15.0)
MCH: 27.5 pg (ref 26.0–34.0)
MCHC: 32.3 g/dL (ref 30.0–36.0)
MCV: 85.2 fL (ref 80.0–100.0)
Platelets: 281 10*3/uL (ref 150–400)
RBC: 4.47 MIL/uL (ref 3.87–5.11)
RDW: 13.5 % (ref 11.5–15.5)
WBC: 6.8 10*3/uL (ref 4.0–10.5)
nRBC: 0 % (ref 0.0–0.2)

## 2021-04-18 LAB — I-STAT BETA HCG BLOOD, ED (MC, WL, AP ONLY): I-stat hCG, quantitative: <5 m[IU]/mL (ref <5)

## 2021-04-18 LAB — ETHANOL: Alcohol, Ethyl (B): <10 mg/dL (ref <10)

## 2021-04-18 LAB — ACETAMINOPHEN LEVEL: Acetaminophen (Tylenol), Serum: <10 ug/mL — ABNORMAL LOW (ref 10–30)

## 2021-04-18 MED ORDER — INSULIN ASPART 100 UNIT/ML IJ SOLN
0.0000 [IU] | Freq: Three times a day (TID) | INTRAMUSCULAR | Status: DC
  Administered 2021-04-19: 5 [IU] via SUBCUTANEOUS
  Administered 2021-04-19 (×2): 2 [IU] via SUBCUTANEOUS
  Filled 2021-04-18: qty 0.15

## 2021-04-18 MED ORDER — INSULIN ASPART 100 UNIT/ML IJ SOLN
0.0000 [IU] | Freq: Every day | INTRAMUSCULAR | Status: DC
  Administered 2021-04-19: 2 [IU] via SUBCUTANEOUS
  Filled 2021-04-18: qty 0.05

## 2021-04-18 MED ORDER — ONDANSETRON HCL 4 MG PO TABS: 4.0000 mg | ORAL_TABLET | Freq: Three times a day (TID) | ORAL | Status: DC | PRN

## 2021-04-18 MED ORDER — ACETAMINOPHEN 325 MG PO TABS: 650.0000 mg | ORAL_TABLET | ORAL | Status: DC | PRN

## 2021-04-18 MED ORDER — NICOTINE 21 MG/24HR TD PT24: 21.0000 mg | MEDICATED_PATCH | Freq: Every day | TRANSDERMAL | Status: DC

## 2021-04-18 MED ORDER — ALUM & MAG HYDROXIDE-SIMETH 200-200-20 MG/5ML PO SUSP: 30.0000 mL | Freq: Four times a day (QID) | ORAL | Status: DC | PRN

## 2021-04-18 MED ORDER — INSULIN ASPART 100 UNIT/ML IJ SOLN
6.0000 [IU] | Freq: Three times a day (TID) | INTRAMUSCULAR | Status: DC
  Administered 2021-04-19 (×3): 6 [IU] via SUBCUTANEOUS
  Filled 2021-04-18: qty 0.06

## 2021-04-18 NOTE — ED Notes (Signed)
TTS complete 

## 2021-04-18 NOTE — ED Notes (Addendum)
Patient has been changed into burgundy scrubs. Patient belonging have been placed in a bag with her name on it, it was placed in the Patient Belongings Room 5-8 cabinet. Patient had a purse, shorts, shirt, jacket, shoes, socks. Patient has been waded by security.

## 2021-04-18 NOTE — ED Notes (Signed)
Patient's belongings placed in locker 29.

## 2021-04-18 NOTE — ED Notes (Signed)
TTS in progress 

## 2021-04-18 NOTE — ED Notes (Signed)
Patient refuses to cooperate for vitals at this time.

## 2021-04-18 NOTE — ED Triage Notes (Signed)
Patient arrives via [employer]. Per [employer] IVC paperwork taken out by group home staff stating the patient is a harm to herself and others.  Hx of Bipolar, depression, NOS, ADHD, and Schizophrenia. Group home reports pt makes irrational decisions and threats. They report she has been jumping into cars with strangers, talking to herself and to people who are not there.

## 2021-04-18 NOTE — ED Provider Notes (Signed)
[facility]-EMERGENCY DEPT Provider Note   CSN: [id number] Arrival date & time: 04/18/21  1928     History Chief Complaint  Patient presents with  . IVC    Alexandria Reynolds is a 30 y.o. female.  Alexandria Reynolds was involuntarily committed by her ADF.  Her perspective, she went to visit friends, and she returned to find police.  She states that she "told on" her family for using marijuana, and she thinks they are trying to retaliate against her.  She states that she has been compliant with her medication.  The history is provided by the patient and the police.  Mental Health Problem Presenting symptoms: aggressive behavior   Patient accompanied by:  Law enforcement Degree of incapacity (severity):  Severe Onset quality:  Sudden Duration:  1 day Timing:  Constant Progression:  Worsening Chronicity:  New Context: not noncompliant and not stressful life event   Treatment compliance:  All of the time Relieved by:  Nothing Worsened by:  Nothing Ineffective treatments:  None tried Associated symptoms: abdominal pain ("burping farts") and headaches   Associated symptoms: no chest pain        Past Medical History:  Diagnosis Date  . ADHD (attention deficit hyperactivity disorder)   . Bipolar disorder (HCC)   . Depression   . Diabetes mellitus, type II (HCC)   . Disorder of intellectual development   . Schizoaffective disorder [facility](HCC)     Patient Active Problem List   Diagnosis Date Noted  . Schizoaffective disorder (HCC) 07/09/2020  . 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 Developmental disability 08/01/2016  . Bipolar I disorder (HCC) 03/28/2016  . Bipolar affective disorder (HCC) 02/15/2016  . Schizophrenia (HCC) 02/15/2016  . Asthma, mild intermittent 09/19/2013  . Absence of menstruation 07/08/2013  . Type 2 diabetes mellitus (HCC) 07/08/2013  . H/O infectious disease 07/08/2013  . Adiposity  07/08/2013  . Current smoker 07/08/2013    History reviewed. No pertinent surgical history.   OB History   No obstetric history on file.     Family History  Problem Relation Age of Onset  . Drug abuse Mother   . Alcohol abuse Mother   . Alcohol abuse Brother   . Drug abuse Brother   . Alcohol abuse Brother     Social History   Tobacco Use  . Smoking status: Current Every Day Smoker    Packs/day: 0.05    Types: Cigarettes    Start date: 02/14/2014  . Smokeless tobacco: Never Used  Vaping Use  . Vaping Use: Never used  Substance Use Topics  . Alcohol use: No    Alcohol/week: 0.0 standard drinks  . Drug use: No    Home Medications Prior to Admission medications   Medication Sig Start Date End Date Taking? Authorizing Provider  benztropine (COGENTIN) 0.5 MG tablet Take 1 tablet (0.5 mg total) by mouth 2 (two) times daily as needed for tremors. 08/21/20   Charm RingsLord, Jamison Y, NP  chlorproMAZINE (THORAZINE) 100 MG tablet Take 1 tablet (100 mg total) by mouth at bedtime. 07/09/20   Clapacs, Jackquline DenmarkJohn T, MD  DULoxetine (CYMBALTA) 20 MG capsule Take 1 capsule (20 mg total) by mouth daily. 08/22/20   Charm RingsLord, Jamison Y, NP  hydrOXYzine (ATARAX/VISTARIL) 25 MG tablet Take 25 mg by mouth daily. 03/26/21   [provider]  insulin aspart (NOVOLOG) 100 UNIT/ML injection Inject 12 Units into the skin 3 (three) times daily with meals. 07/09/20  Clapacs, Jackquline Denmark, MD  insulin detemir (LEVEMIR) 100 UNIT/ML injection Inject 0.23 mLs (23 Units total) into the skin at bedtime. 07/09/20   Clapacs, Jackquline Denmark, MD  lamoTRIgine (LAMICTAL) 100 MG tablet Take 100 mg by mouth 2 (two) times daily. 03/25/21   [provider]  lamoTRIgine (LAMICTAL) 200 MG tablet Take 1 tablet (200 mg total) by mouth daily. 07/09/20   Clapacs, Jackquline Denmark, MD  loratadine (CLARITIN) 10 MG tablet Take 1 tablet (10 mg total) by mouth daily. 07/10/20   Clapacs, Jackquline Denmark, MD  LORazepam (ATIVAN) 0.5 MG tablet Take 0.5 mg by mouth 2 (two)  times daily as needed. 04/13/21   [provider]  LORazepam (ATIVAN) 1 MG tablet Take 1 tablet (1 mg total) by mouth daily as needed. 07/09/20   Clapacs, Jackquline Denmark, MD  lurasidone (LATUDA) 80 MG TABS tablet Take 1 tablet (80 mg total) by mouth at bedtime. 07/09/20   Clapacs, Jackquline Denmark, MD  metFORMIN (GLUCOPHAGE) 1000 MG tablet Take 1 tablet by mouth 2 (two) times daily. 04/01/21   [provider]  metFORMIN (GLUCOPHAGE) 500 MG tablet Take 1 tablet (500 mg total) by mouth 2 (two) times daily with a meal. 07/09/20   Clapacs, Jackquline Denmark, MD  metoCLOPramide (REGLAN) 10 MG tablet Take 10 mg by mouth at bedtime. 07/28/20   [provider]  MYRBETRIQ 50 MG TB24 tablet Take 50 mg by mouth daily. 07/28/20   [provider]  potassium chloride (KLOR-CON) 10 MEQ tablet Take 1 tablet (10 mEq total) by mouth daily. 08/17/20   Willy Eddy, MD  risperiDONE (RISPERDAL) 2 MG tablet Take 1 tablet (2 mg total) by mouth at bedtime. 08/21/20   Charm Rings, NP  sertraline (ZOLOFT) 100 MG tablet Take by mouth. 04/01/21   [provider]  TRADJENTA 5 MG TABS tablet Take 5 mg by mouth daily. 04/01/21   [provider]  traZODone (DESYREL) 100 MG tablet Take 1 tablet (100 mg total) by mouth at bedtime. 07/09/20   Clapacs, Jackquline Denmark, MD  traZODone (DESYREL) 150 MG tablet Take 150 mg by mouth at bedtime. 04/01/21   [provider]    Allergies    Penicillins  Review of Systems   Review of Systems  Constitutional: Negative for chills and fever.  HENT: Negative for ear pain and sore throat.   Eyes: Negative for pain and visual disturbance.  Respiratory: Negative for cough and shortness of breath.   Cardiovascular: Negative for chest pain and palpitations.  Gastrointestinal: Positive for abdominal pain ("burping farts"). Negative for vomiting.  Genitourinary: Negative for dysuria and hematuria.  Musculoskeletal: Negative for arthralgias and back pain.  Skin: Negative for color  change and rash.  Neurological: Positive for headaches. Negative for seizures and syncope.  All other systems reviewed and are negative.   Physical Exam Updated Vital Signs BP (!) 144/83   Pulse 81   Resp 18   SpO2 100%   Physical Exam Vitals and nursing note reviewed.  Constitutional:      General: She is not in acute distress.    Appearance: She is well-developed.  HENT:     Head: Normocephalic and atraumatic.  Eyes:     Conjunctiva/sclera: Conjunctivae normal.  Cardiovascular:     Rate and Rhythm: Normal rate and regular rhythm.     Heart sounds: No murmur heard.   Pulmonary:     Effort: Pulmonary effort is normal. No respiratory distress.     Breath sounds:  Normal breath sounds.  Abdominal:     Palpations: Abdomen is soft.     Tenderness: There is no abdominal tenderness.  Musculoskeletal:     Cervical back: Neck supple.  Skin:    General: Skin is warm and dry.  Neurological:     General: No focal deficit present.     Mental Status: She is alert and oriented to person, place, and time.  Psychiatric:        Attention and Perception: Attention and perception normal.        Mood and Affect: Affect is angry.        Speech: Speech normal.        Behavior: Behavior is cooperative.        Thought Content: Thought content normal.        Cognition and Memory: Cognition and memory normal.        Judgment: Judgment normal.     ED Results / Procedures / Treatments   Labs (all labs ordered are listed, but only abnormal results are displayed) Labs Reviewed  COMPREHENSIVE METABOLIC PANEL  ETHANOL  SALICYLATE LEVEL  ACETAMINOPHEN LEVEL  CBC  RAPID URINE DRUG SCREEN, HOSP PERFORMED  I-STAT BETA HCG BLOOD, ED ([facility], [facility], [facility] ONLY)    EKG None  Radiology DG Abd Acute W/Chest  Result Date: 04/18/2021 CLINICAL DATA:  Bipolar, schizophrenia, irrational behavior, hallucinations EXAM: DG ABDOMEN ACUTE WITH 1 VIEW CHEST COMPARISON:  06/15/2020 FINDINGS: There is no  evidence of dilated bowel loops or free intraperitoneal air. No radiopaque calculi or other significant radiographic abnormality is seen. Heart size and mediastinal contours are within normal limits. Both lungs are clear. IMPRESSION: 1.  No acute abnormality of the lungs. 2. Nonobstructive pattern of bowel gas. No free air in the abdomen. Electronically Signed   By: Lauralyn Primes M.D.   On: 04/18/2021 21:41    Procedures Procedures   Medications Ordered in ED Medications  acetaminophen (TYLENOL) tablet 650 mg (has no administration in time range)  ondansetron (ZOFRAN) tablet 4 mg (has no administration in time range)  alum & mag hydroxide-simeth (MAALOX/MYLANTA) 200-200-20 MG/5ML suspension 30 mL (has no administration in time range)  nicotine (NICODERM CQ - dosed in mg/24 hours) patch 21 mg (has no administration in time range)  insulin aspart (novoLOG) injection 6 Units (has no administration in time range)  insulin aspart (novoLOG) injection 0-5 Units (has no administration in time range)  insulin aspart (novoLOG) injection 0-15 Units (has no administration in time range)    ED Course  I have reviewed the triage vital signs and the nursing notes.  Pertinent labs & imaging results that were available during my care of the patient were reviewed by me and considered in my medical decision making (see chart for details).  Clinical Course as of 04/18/21 2353  Mon Apr 18, 2021  2352 Alexandria Reynolds has been seen by Duard Brady who recommends the patient be seen by psychiatry in the morning. [AW]    Clinical Course User Index [AW] Koleen Distance, MD   MDM Rules/Calculators/A&P                          Alexandria Reynolds presents with law enforcement secondary to her ADF family pursuing involuntary commitment.  She was complaining of some nausea, 1 episode of vomiting, and some feculent burping.  Abdominal exam was overall reassuring, but I did order an acute abdominal series.   Otherwise,  she appears to be medically clear for behavioral health disposition.  She is currently awaiting psychiatry consultation which will occur in the morning. Final Clinical Impression(s) / ED Diagnoses Final diagnoses:  Behavior concern in adult    Rx / DC Orders ED Discharge Orders    None       Koleen Distance, MD 04/18/21 2355
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
## 2021-04-19 ENCOUNTER — Other Ambulatory Visit: Payer: Self-pay

## 2021-04-19 DIAGNOSIS — F69 Unspecified disorder of adult personality and behavior: Secondary | ICD-10-CM | POA: Diagnosis not present

## 2021-04-19 DIAGNOSIS — F333 Major depressive disorder, recurrent, severe with psychotic symptoms: Secondary | ICD-10-CM | POA: Diagnosis not present

## 2021-04-19 LAB — CBG MONITORING, ED
Glucose-Capillary: 121 mg/dL — ABNORMAL HIGH (ref 70–99)
Glucose-Capillary: 138 mg/dL — ABNORMAL HIGH (ref 70–99)
Glucose-Capillary: 234 mg/dL — ABNORMAL HIGH (ref 70–99)
Glucose-Capillary: 235 mg/dL — ABNORMAL HIGH (ref 70–99)

## 2021-04-19 LAB — SARS CORONAVIRUS 2 (TAT 6-24 HRS): SARS Coronavirus 2: NEGATIVE

## 2021-04-19 NOTE — ED Notes (Signed)
Spoke with Joycelyn Man and she is sending someone to pick up pt after 3pm.

## 2021-04-19 NOTE — Discharge Instructions (Addendum)
For your behavioral health needs, you are advised to keep your previously scheduled appointment with Thedore Mins, MD on Monday May 02, 2021:        [facility]      [address]      [place], [place] [address]      [phone]  As an alternative, contact [facility]:       [facility]      [address]      [place], [place] [address]      [phone]      They offer psychiatry/medication management, therapy and substance abuse treatment.  New patients are being seen in their walk-in clinic.  Walk-in hours are Monday - Thursday from 8:00 am - 11:00 am for psychiatry, and Friday from 1:00 pm - 4:00 pm for therapy.  Walk-in patients are seen on a first come, first served basis, so try to arrive as early as possible for the best chance of being seen the same day.

## 2021-04-19 NOTE — ED Notes (Signed)
Attempted to call for pt pick up. No answer. Will try again

## 2021-04-19 NOTE — Progress Notes (Signed)
Inpatient Diabetes Program Recommendations  AACE/ADA: New Consensus Statement on Inpatient Glycemic Control (2015)  Target Ranges:  Prepandial:   less than 140 mg/dL      Peak postprandial:   less than 180 mg/dL (1-2 hours)      Critically ill patients:  140 - 180 mg/dL   Results for Alexandria Reynolds, Alexandria Reynolds (MRN [id number]) as of 04/19/2021 07:53  Ref. Range 04/19/2021 00:15 04/19/2021 07:41  Glucose-Capillary Latest Ref Range: 70 - 99 mg/dL 014 (H) 103 (H)     Home DM Meds: Levemir 23 units QHS           Novolog 12 units TID        Metformin 1000 mg BID       Tradjenta 5 mg Daily  Current Orders: Novolog 0-15 units ac/hs       Novolog 6 units TID with meals    MD- Note patient taking Levemir insulin at home as well.  CBG 234 this AM  Please consider starting Levemir 12 units Daily  (50% home dose to start)   --Will follow patient during hospitalization--  Ambrose Finland RN, MSN, CDE Diabetes Coordinator Inpatient Glycemic Control Team Team Pager: [phone] (8a-5p)

## 2021-04-19 NOTE — ED Notes (Signed)
Pt is still waiting on transportation, spoke with Zella Ball numerous times and she states she is sending someone to pick pt up.
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
## 2021-04-19 NOTE — ED Notes (Signed)
Pt is waiting on group home director to send someone to get her.

## 2021-04-19 NOTE — BH Assessment (Signed)
[facility] Assessment Progress Note  Per Vernard Gambles, NP, this pt does not require psychiatric hospitalization at this time.  Pt presents under IVC initiated by a staff member at pt's [facility] and upheld by EDP Pieter Partridge, MD, which has been rescinded by Nelly Rout, MD.  Pt is psychiatrically cleared.  Discharge instructions advise pt to continue treatment with her current outpatient provider, offering information about [facility] as an alternative.  Eber Jones reports that she has already spoken to pt's [facility] staff and that they have agreed to pt's return.  At 09:56 I spoke to pt's putative legal guardian with the [facility] [address], [address] [phone]).  I informed him of pt's disposition and asked that he send letter of guardianship to me as soon as possible, to which he agreed.  Receipt is pending as of this writing.  EDP Derwood Kaplan, MD and pt's nurse, Alvino Chapel, have been notified.  Doylene Canning, MA Triage Specialist [phone]

## 2021-04-19 NOTE — Consult Note (Signed)
[facility] Face-to-Face Psychiatry Consult   Reason for Consult:  Psychiatric assessment related to IVC from [facility], [facility] stats she is a danger to herself and others. Referring Physician:  Dr. Rhunette Croft Patient Identification: Alexandria Reynolds MRN:  [id number] Principal Diagnosis: Behavior concern in adult Diagnosis:  Principal Problem:   Behavior concern in adult Active Problems:   Bipolar affective disorder (HCC)   Total Time spent with patient: 30 minutes  Subjective:   Alexandria Reynolds is a 30 y.o. female patient presented to the [facility]  via [employer] under IVC. She talks and answers herself and to people not there. She communicates threats, refuses to follow group home rules paperwork. [facility] states she is a danger to herself and others.   Alexandria Reynolds, 30 y.o., female patient seen face to face  by this provider, consulted with Dr. Lucianne Muss; and chart reviewed on 04/19/21.     HPI:   During evaluation Alexandria Reynolds is in sitting position in no acute distress. She makes good eye contact. She is alert, oriented x 4, calm and cooperative. She reports her mood as "good"  with congruent affect.  Patient denies being depressed. She does not appear to be responding to internal/external stimuli or delusional thoughts.  Patient denies suicidal/self-harm/homicidal ideation, psychosis, and paranoia.  Patient reports that she sleeps 7 hours per night and has no issues with her appetite.Patient answered question appropriately,   Patient states she lives at an [facility] home.  States she normally gets along with her staff.  Reports that her staff member was smoking weed and allowed her to take a "hit".  States that has been a few weeks ago.  Reports she reported this staff member for smoking weed and allowing her to take a "hit".  States it is the staff member who initiated the IVC. Patient UDS is negative.  Patient reports she does leave the home at times, and she visits elderly couple that lives nearby.  Reports when  staff called and asked where she was she said she was in the car because she did not want to say she was at the elderly neighbors home.  States  she is always there to take her medications on time.  Patient denies that she is aggressive with anyone in her home. States she is not a danger to herself or anyone else. States when the police came to serve the IVC paperwork she did not understand what was going on.  It scared her and she ran from the police.  Patient denies access to firearms/weapons.  Patient contracts for safety. States, "I would never hurt myself".  Patient denies any suicidal attempts.  States that she has a history of hospitalizations.  Her last hospitalization was 7 months ago.  Patient reports she would engage in therapy once it is set up.  Patient reports she has seen Dr. Lendon Colonel in [facility].  Patient denies any alcohol or illegal substance use, except for the 1 hit of weed that she admitted to a few weeks ago.  Collateral: Joycelyn Man [phone] program director.  Alexandria Reynolds states the patient has a hard time dealing with her anger, when something does not go her way.  Reports that she hides from staff.  States when she hides from staff, it takes up resources.  States, "hopefully this situation will help her know how very serious it is that she just cannot run off".  Reports patient has had 4 incidents of running off.  States at this time patient  does not have services in place.  States her Medicare is with [facility] and thier team is working to get her switched over to [place].  Reports she currently has an appointment with a psychiatrist on June 6.  States they have set therapy up for patient in the past but patient gets tired of it and quits. Reports no immediate safety concerns with patient returning to home.  Reports patient has a state appointed legal guardian Alexandria Reynolds [phone]   Past Psychiatric History: Bipolar Affective, Developmental Disability Risk to Self:  pt  denies Risk to Others:  pt denies Prior Inpatient Therapy:  yes Prior Outpatient Therapy:   yes  Past Medical History:  Past Medical History:  Diagnosis Date  . ADHD (attention deficit hyperactivity disorder)   . Bipolar disorder (HCC)   . Depression   . Diabetes mellitus, type II (HCC)   . Disorder of intellectual development   . Schizoaffective disorder (HCC)    History reviewed. No pertinent surgical history. Family History:  Family History  Problem Relation Age of Onset  . Drug abuse Mother   . Alcohol abuse Mother   . Alcohol abuse Brother   . Drug abuse Brother   . Alcohol abuse Brother    Family Psychiatric  History: unkown Social History:  Social History   Substance and Sexual Activity  Alcohol Use No  . Alcohol/week: 0.0 standard drinks     Social History   Substance and Sexual Activity  Drug Use No    Social History   Socioeconomic History  . Marital status: Single    Spouse name: Not on file  . Number of children: Not on file  . Years of education: Not on file  . Highest education level: Not on file  Occupational History  . Not on file  Tobacco Use  . Smoking status: Current Every Day Smoker    Packs/day: 0.05    Types: Cigarettes    Start date: 02/14/2014  . Smokeless tobacco: Never Used  Vaping Use  . Vaping Use: Never used  Substance and Sexual Activity  . Alcohol use: No    Alcohol/week: 0.0 standard drinks  . Drug use: No  . Sexual activity: Never  Other Topics Concern  . Not on file  Social History Narrative  . Not on file   Social Determinants of Health   Financial Resource Strain: Not on file  Food Insecurity: Not on file  Transportation Needs: Not on file  Physical Activity: Not on file  Stress: Not on file  Social Connections: Not on file   Additional Social History:    Allergies:   Allergies  Allergen Reactions  . Penicillins Rash    Did it involve swelling of the face/tongue/throat, SOB, or low BP? No Did it  involve sudden or severe rash/hives, skin peeling, or any reaction on the inside of your mouth or nose? Yes Did you need to seek medical attention at a hospital or doctor's office? No When did it last happen? If all above answers are "NO", may proceed with cephalosporin use.    Labs:  Results for orders placed or performed during the hospital encounter of 04/18/21 (from the past 48 hour(s))  Rapid urine drug screen (hospital performed)     Status: None   Collection Time: 04/18/21  8:00 PM  Result Value Ref Range   Opiates NONE DETECTED NONE DETECTED   Cocaine NONE DETECTED NONE DETECTED   Benzodiazepines NONE DETECTED NONE DETECTED   Amphetamines NONE DETECTED  NONE DETECTED   Tetrahydrocannabinol NONE DETECTED NONE DETECTED   Barbiturates NONE DETECTED NONE DETECTED    Comment: (NOTE) DRUG SCREEN FOR MEDICAL PURPOSES ONLY.  IF CONFIRMATION IS NEEDED FOR ANY PURPOSE, NOTIFY LAB WITHIN 5 DAYS.  LOWEST DETECTABLE LIMITS FOR URINE DRUG SCREEN Drug Class                     Cutoff (ng/mL) Amphetamine and metabolites    1000 Barbiturate and metabolites    200 Benzodiazepine                 200 Tricyclics and metabolites     300 Opiates and metabolites        300 Cocaine and metabolites        300 THC                            50 Performed at [facility], [address] [place], [place] [address]   Comprehensive metabolic panel     Status: Abnormal   Collection Time: 04/18/21  8:09 PM  Result Value Ref Range   Sodium 134 (L) 135 - 145 mmol/L   Potassium 3.8 3.5 - 5.1 mmol/L   Chloride 105 98 - 111 mmol/L   CO2 23 22 - 32 mmol/L   Glucose, Bld 301 (H) 70 - 99 mg/dL    Comment: Glucose reference range applies only to samples taken after fasting for at least 8 hours.   BUN 16 6 - 20 mg/dL   Creatinine, Ser 5.68 0.44 - 1.00 mg/dL   Calcium 9.0 8.9 - 12.7 mg/dL   Total Protein 8.6 (H) 6.5 - 8.1 g/dL   Albumin 4.1 3.5 - 5.0 g/dL   AST 18 15 - 41 U/L    ALT 20 0 - 44 U/L   Alkaline Phosphatase 44 38 - 126 U/L   Total Bilirubin 0.4 0.3 - 1.2 mg/dL   GFR, Estimated >51 >70 mL/min    Comment: (NOTE) Calculated using the CKD-EPI Creatinine Equation (2021)    Anion gap 6 5 - 15    Comment: Performed at [facility], [address] [place], [place] [address]  Ethanol     Status: None   Collection Time: 04/18/21  8:09 PM  Result Value Ref Range   Alcohol, Ethyl (B) <10 <10 mg/dL    Comment: (NOTE) Lowest detectable limit for serum alcohol is 10 mg/dL.  For medical purposes only. Performed at [facility], [address] [place], [place] [address]   Salicylate level     Status: Abnormal   Collection Time: 04/18/21  8:09 PM  Result Value Ref Range   Salicylate Lvl <7.0 (L) 7.0 - 30.0 mg/dL    Comment: Performed at [facility], [address] [place], [place] [address]  Acetaminophen level     Status: Abnormal   Collection Time: 04/18/21  8:09 PM  Result Value Ref Range   Acetaminophen (Tylenol), Serum <10 (L) 10 - 30 ug/mL    Comment: (NOTE) Therapeutic concentrations vary significantly. A range of 10-30 ug/mL  may be an effective concentration for many patients. However, some  are best treated at concentrations outside of this range. Acetaminophen concentrations >150 ug/mL at 4 hours after ingestion  and >50 ug/mL at 12 hours after ingestion are often associated with  toxic reactions.  Performed at [facility], [address]  [place], [place] [address]   cbc     Status: None   Collection Time: 04/18/21  8:09 PM  Result Value Ref Range   WBC 6.8 4.0 - 10.5 K/uL   RBC 4.47 3.87 - 5.11 MIL/uL   Hemoglobin 12.3 12.0 - 15.0 g/dL   HCT 60.4 54.0 - 98.1 %   MCV 85.2 80.0 - 100.0 fL   MCH 27.5 26.0 - 34.0 pg   MCHC 32.3 30.0 - 36.0 g/dL   RDW 19.1 47.8 - 29.5 %   Platelets 281 150 - 400 K/uL   nRBC 0.0 0.0 - 0.2 %    Comment: Performed at [facility], [address], [place], [place] [address]  I-Stat beta hCG blood, ED     Status: None   Collection Time: 04/18/21  8:14 PM  Result Value Ref Range   I-stat hCG, quantitative <5.0 <5 mIU/mL   Comment 3            Comment:   GEST. AGE      CONC.  (mIU/mL)   <=1 WEEK        5 - 50     2 WEEKS       50 - 500     3 WEEKS       100 - 10,000     4 WEEKS     1,000 - 30,000        FEMALE AND NON-PREGNANT FEMALE:     LESS THAN 5 mIU/mL   SARS CORONAVIRUS 2 (TAT 6-24 HRS) Nasopharyngeal Nasopharyngeal Swab     Status: None   Collection Time: 04/18/21  8:21 PM   Specimen: Nasopharyngeal Swab  Result Value Ref Range   SARS Coronavirus 2 NEGATIVE NEGATIVE    Comment: (NOTE) SARS-CoV-2 target nucleic acids are NOT DETECTED.  The SARS-CoV-2 RNA is generally detectable in upper and lower respiratory specimens during the acute phase of infection. Negative results do not preclude SARS-CoV-2 infection, do not rule out co-infections with other pathogens, and should not be used as the sole basis for treatment or other patient management decisions. Negative results must be combined with clinical observations, patient history, and epidemiological information. The expected result is Negative.  Fact Sheet for Patients: HairSlick.no  Fact Sheet for Healthcare Providers: quierodirigir.com  This test is not yet approved or cleared by the [place] FDA and  has been authorized for detection and/or diagnosis of SARS-CoV-2 by FDA under an Emergency Use Authorization (EUA). This EUA will remain  in effect (meaning this test can be used) for the duration of the COVID-19 declaration under Se ction 564(b)(1) of the Act, 21 U.S.C. section 360bbb-3(b)(1), unless the authorization is terminated or revoked sooner.  Performed at [facility], [address] [place], [place] [address]   CBG monitoring, ED     Status:  Abnormal   Collection Time: 04/19/21 12:15 AM  Result Value Ref Range   Glucose-Capillary 235 (H) 70 - 99 mg/dL    Comment: Glucose reference range applies only to samples taken after fasting for at least 8 hours.  POC CBG, ED     Status: Abnormal   Collection Time: 04/19/21  7:41 AM  Result Value Ref Range   Glucose-Capillary 234 (H) 70 - 99 mg/dL    Comment: Glucose reference range applies only to samples taken after fasting for at least 8 hours.    Current Facility-Administered Medications  Medication Dose Route Frequency Provider Last Rate Last Admin  .  acetaminophen (TYLENOL) tablet 650 mg  650 mg Oral Q4H PRN Koleen Distance, MD      . alum & mag hydroxide-simeth (MAALOX/MYLANTA) 200-200-20 MG/5ML suspension 30 mL  30 mL Oral Q6H PRN Koleen Distance, MD      . insulin aspart (novoLOG) injection 0-15 Units  0-15 Units Subcutaneous TID WC Koleen Distance, MD      . insulin aspart (novoLOG) injection 0-5 Units  0-5 Units Subcutaneous QHS Koleen Distance, MD   2 Units at 04/19/21 0024  . insulin aspart (novoLOG) injection 6 Units  6 Units Subcutaneous TID WC Koleen Distance, MD      . nicotine (NICODERM CQ - dosed in mg/24 hours) patch 21 mg  21 mg Transdermal Daily Koleen Distance, MD      . ondansetron [facility]) tablet 4 mg  4 mg Oral Q8H PRN Koleen Distance, MD       Current Outpatient Medications  Medication Sig Dispense Refill  . benztropine (COGENTIN) 0.5 MG tablet Take 1 tablet (0.5 mg total) by mouth 2 (two) times daily as needed for tremors. 60 tablet 1  . chlorproMAZINE (THORAZINE) 100 MG tablet Take 1 tablet (100 mg total) by mouth at bedtime. 30 tablet 0  . DULoxetine (CYMBALTA) 20 MG capsule Take 1 capsule (20 mg total) by mouth daily. 30 capsule 3  . hydrOXYzine (ATARAX/VISTARIL) 25 MG tablet Take 25 mg by mouth daily.    . insulin aspart (NOVOLOG) 100 UNIT/ML injection Inject 12 Units into the skin 3 (three) times daily with meals. 10 mL 0  . insulin detemir (LEVEMIR) 100  UNIT/ML injection Inject 0.23 mLs (23 Units total) into the skin at bedtime. 10 mL 0  . lamoTRIgine (LAMICTAL) 100 MG tablet Take 100 mg by mouth 2 (two) times daily.    Marland Kitchen lamoTRIgine (LAMICTAL) 200 MG tablet Take 1 tablet (200 mg total) by mouth daily. 30 tablet 0  . loratadine (CLARITIN) 10 MG tablet Take 1 tablet (10 mg total) by mouth daily. 30 tablet 0  . LORazepam (ATIVAN) 0.5 MG tablet Take 0.5 mg by mouth 2 (two) times daily as needed.    Marland Kitchen LORazepam (ATIVAN) 1 MG tablet Take 1 tablet (1 mg total) by mouth daily as needed. 30 tablet 0  . lurasidone (LATUDA) 80 MG TABS tablet Take 1 tablet (80 mg total) by mouth at bedtime. 30 tablet 0  . metFORMIN (GLUCOPHAGE) 1000 MG tablet Take 1 tablet by mouth 2 (two) times daily.    . metFORMIN (GLUCOPHAGE) 500 MG tablet Take 1 tablet (500 mg total) by mouth 2 (two) times daily with a meal. 60 tablet 0  . metoCLOPramide (REGLAN) 10 MG tablet Take 10 mg by mouth at bedtime.    Marland Kitchen MYRBETRIQ 50 MG TB24 tablet Take 50 mg by mouth daily.    . potassium chloride (KLOR-CON) 10 MEQ tablet Take 1 tablet (10 mEq total) by mouth daily. 7 tablet 0  . risperiDONE (RISPERDAL) 2 MG tablet Take 1 tablet (2 mg total) by mouth at bedtime. 30 tablet 1  . sertraline (ZOLOFT) 100 MG tablet Take by mouth.    . TRADJENTA 5 MG TABS tablet Take 5 mg by mouth daily.    . traZODone (DESYREL) 100 MG tablet Take 1 tablet (100 mg total) by mouth at bedtime. 30 tablet 0  . traZODone (DESYREL) 150 MG tablet Take 150 mg by mouth at bedtime.      Musculoskeletal: Strength & Muscle Tone: within  normal limits Gait & Station: normal Patient leans: N/A   Psychiatric Specialty Exam:  Presentation  General Appearance: Fairly Groomed  Eye Contact:Good  Speech:Clear and Coherent; Normal Rate  Speech Volume:Normal  Handedness:Right   Mood and Affect  Mood:Anxious  Affect:Congruent   Thought Process  Thought Processes:Coherent  Descriptions of  Associations:Intact  Orientation:Full (Time, Place and Person)  Thought Content:Logical  History of Schizophrenia/Schizoaffective disorder:No  Duration of Psychotic Symptoms:No data recorded Hallucinations:Hallucinations: None  Ideas of Reference:None  Suicidal Thoughts:Suicidal Thoughts: No  Homicidal Thoughts:Homicidal Thoughts: No   Sensorium  Memory:Immediate Good; Recent Good; Remote Good  Judgment:Fair  Insight:Fair   Executive Functions  Concentration:Good  Attention Span:Good  Recall:Good  Fund of Knowledge:Good  Language:Good   Psychomotor Activity  Psychomotor Activity:Psychomotor Activity: Normal   Assets  Assets:Communication Skills; Housing; Health and safety inspector; Desire for Improvement; Leisure Time; Physical Health; Resilience; Social Support; Vocational/Educational   Sleep  Sleep:Sleep: Good Number of Hours of Sleep: 7   Physical Exam: Physical Exam Constitutional:      Appearance: Normal appearance.  HENT:     Head: Normocephalic.     Right Ear: External ear normal.     Left Ear: External ear normal.     Nose: No congestion.     Mouth/Throat:     Pharynx: Oropharynx is clear.  Eyes:     Conjunctiva/sclera: Conjunctivae normal.  Cardiovascular:     Rate and Rhythm: Normal rate.     Pulses: Normal pulses.  Pulmonary:     Effort: Pulmonary effort is normal.  Abdominal:     Tenderness: There is no guarding.  Musculoskeletal:        General: Normal range of motion.     Cervical back: Normal range of motion.  Skin:    General: Skin is warm and dry.     Capillary Refill: Capillary refill takes less than 2 seconds.  Neurological:     Mental Status: She is alert and oriented to person, place, and time.  Psychiatric:        Attention and Perception: Attention and perception normal.        Mood and Affect: Mood is anxious.        Speech: Speech normal.        Behavior: Behavior normal. Behavior is cooperative.         Thought Content: Thought content normal.        Cognition and Memory: Cognition normal.        Judgment: Judgment is impulsive.    Review of Systems  Constitutional: Negative.   HENT: Negative.   Eyes: Negative.   Respiratory: Negative.   Cardiovascular: Negative.   Gastrointestinal: Negative.   Genitourinary: Negative.   Musculoskeletal: Negative.   Skin: Negative.   Neurological: Negative.   Endo/Heme/Allergies: Negative.   Psychiatric/Behavioral: The patient is nervous/anxious.    Blood pressure (!) 152/89, pulse 76, temperature (!) 97.4 F (36.3 C), temperature source Oral, resp. rate 20, SpO2 99 %. There is no height or weight on file to calculate BMI.  Treatment Plan Summary:  Per Joycelyn Man, patient has Psychiatrist appt 05/02/2021. Once Medicaid is switched to [place], Alexandria Reynolds states they will set up out patient therapy.  Disposition: No evidence of imminent risk to self or others at present.   Patient does not meet criteria for psychiatric inpatient admission. Supportive therapy provided about ongoing stressors. Discussed crisis plan, support from social network, calling 911, coming to the Emergency Department, and calling Suicide Hotline.  Ardis Hughs,  NP 04/19/2021 8:42 AM

## 2021-04-19 NOTE — ED Notes (Signed)
Attempted to call Group home back to get a ETA, no answer

## 2021-04-19 NOTE — ED Notes (Signed)
Patient up requesting something to drink.  Patient is calm and cooperative.

## 2021-04-21 ENCOUNTER — Emergency Department (HOSPITAL_COMMUNITY)
Admission: EM | Admit: 2021-04-21 | Discharge: 2021-04-22 | Disposition: A | Discharge status: Home / Self Care | Payer: Medicare Other | Attending: Emergency Medicine | Admitting: Emergency Medicine

## 2021-04-21 ENCOUNTER — Other Ambulatory Visit: Payer: Self-pay

## 2021-04-21 ENCOUNTER — Encounter (HOSPITAL_COMMUNITY): Payer: Self-pay | Admitting: Emergency Medicine

## 2021-04-21 DIAGNOSIS — Z046 Encounter for general psychiatric examination, requested by authority: Secondary | ICD-10-CM

## 2021-04-21 DIAGNOSIS — F819 Developmental disorder of scholastic skills, unspecified: Secondary | ICD-10-CM | POA: Insufficient documentation

## 2021-04-21 DIAGNOSIS — Z7984 Long term (current) use of oral hypoglycemic drugs: Secondary | ICD-10-CM | POA: Diagnosis not present

## 2021-04-21 DIAGNOSIS — E119 Type 2 diabetes mellitus without complications: Secondary | ICD-10-CM | POA: Insufficient documentation

## 2021-04-21 DIAGNOSIS — J45909 Unspecified asthma, uncomplicated: Secondary | ICD-10-CM | POA: Insufficient documentation

## 2021-04-21 DIAGNOSIS — F4325 Adjustment disorder with mixed disturbance of emotions and conduct: Secondary | ICD-10-CM | POA: Diagnosis not present

## 2021-04-21 DIAGNOSIS — F17210 Nicotine dependence, cigarettes, uncomplicated: Secondary | ICD-10-CM | POA: Insufficient documentation

## 2021-04-21 DIAGNOSIS — Z794 Long term (current) use of insulin: Secondary | ICD-10-CM | POA: Insufficient documentation

## 2021-04-21 DIAGNOSIS — Z79899 Other long term (current) drug therapy: Secondary | ICD-10-CM | POA: Insufficient documentation

## 2021-04-21 DIAGNOSIS — Z008 Encounter for other general examination: Secondary | ICD-10-CM

## 2021-04-21 DIAGNOSIS — F333 Major depressive disorder, recurrent, severe with psychotic symptoms: Secondary | ICD-10-CM | POA: Insufficient documentation

## 2021-04-21 DIAGNOSIS — F89 Unspecified disorder of psychological development: Secondary | ICD-10-CM | POA: Diagnosis present

## 2021-04-21 DIAGNOSIS — Z20822 Contact with and (suspected) exposure to covid-19: Secondary | ICD-10-CM | POA: Diagnosis not present

## 2021-04-21 DIAGNOSIS — F69 Unspecified disorder of adult personality and behavior: Secondary | ICD-10-CM | POA: Diagnosis not present

## 2021-04-21 DIAGNOSIS — F259 Schizoaffective disorder, unspecified: Secondary | ICD-10-CM | POA: Diagnosis present

## 2021-04-21 LAB — RESP PANEL BY RT-PCR (FLU A&B, COVID) ARPGX2
Influenza A by PCR: NEGATIVE
Influenza B by PCR: NEGATIVE
SARS Coronavirus 2 by RT PCR: NEGATIVE

## 2021-04-21 LAB — CBC WITH DIFFERENTIAL/PLATELET
Abs Immature Granulocytes: 0.02 10*3/uL (ref 0.00–0.07)
Basophils Absolute: 0 10*3/uL (ref 0.0–0.1)
Basophils Relative: 0 %
Eosinophils Absolute: 0 10*3/uL (ref 0.0–0.5)
Eosinophils Relative: 0 %
HCT: 39.8 % (ref 36.0–46.0)
Hemoglobin: 13 g/dL (ref 12.0–15.0)
Immature Granulocytes: 0 %
Lymphocytes Relative: 40 %
Lymphs Abs: 2.4 10*3/uL (ref 0.7–4.0)
MCH: 27.7 pg (ref 26.0–34.0)
MCHC: 32.7 g/dL (ref 30.0–36.0)
MCV: 84.7 fL (ref 80.0–100.0)
Monocytes Absolute: 0.3 10*3/uL (ref 0.1–1.0)
Monocytes Relative: 5 %
Neutro Abs: 3.3 10*3/uL (ref 1.7–7.7)
Neutrophils Relative %: 55 %
Platelets: 288 10*3/uL (ref 150–400)
RBC: 4.7 MIL/uL (ref 3.87–5.11)
RDW: 13.2 % (ref 11.5–15.5)
WBC: 6.1 10*3/uL (ref 4.0–10.5)
nRBC: 0 % (ref 0.0–0.2)

## 2021-04-21 LAB — COMPREHENSIVE METABOLIC PANEL
ALT: 25 U/L (ref 0–44)
AST: 25 U/L (ref 15–41)
Albumin: 4.2 g/dL (ref 3.5–5.0)
Alkaline Phosphatase: 48 U/L (ref 38–126)
Anion gap: 9 (ref 5–15)
BUN: 13 mg/dL (ref 6–20)
CO2: 23 mmol/L (ref 22–32)
Calcium: 8.8 mg/dL — ABNORMAL LOW (ref 8.9–10.3)
Chloride: 101 mmol/L (ref 98–111)
Creatinine, Ser: 0.55 mg/dL (ref 0.44–1.00)
GFR, Estimated: >60 mL/min (ref >60)
Glucose, Bld: 242 mg/dL — ABNORMAL HIGH (ref 70–99)
Potassium: 3.4 mmol/L — ABNORMAL LOW (ref 3.5–5.1)
Sodium: 133 mmol/L — ABNORMAL LOW (ref 135–145)
Total Bilirubin: 0.2 mg/dL — ABNORMAL LOW (ref 0.3–1.2)
Total Protein: 8.9 g/dL — ABNORMAL HIGH (ref 6.5–8.1)

## 2021-04-21 LAB — ETHANOL: Alcohol, Ethyl (B): <10 mg/dL (ref <10)

## 2021-04-21 LAB — I-STAT BETA HCG BLOOD, ED (MC, WL, AP ONLY): I-stat hCG, quantitative: <5 m[IU]/mL (ref <5)

## 2021-04-21 LAB — ACETAMINOPHEN LEVEL: Acetaminophen (Tylenol), Serum: <10 ug/mL — ABNORMAL LOW (ref 10–30)

## 2021-04-21 LAB — SALICYLATE LEVEL: Salicylate Lvl: <7 mg/dL — ABNORMAL LOW (ref 7.0–30.0)

## 2021-04-21 MED ORDER — DULOXETINE HCL 20 MG PO CPEP
20.0000 mg | ORAL_CAPSULE | Freq: Every day | ORAL | Status: DC
  Administered 2021-04-22: 20 mg via ORAL
  Filled 2021-04-21: qty 1

## 2021-04-21 MED ORDER — LAMOTRIGINE 100 MG PO TABS
200.0000 mg | ORAL_TABLET | Freq: Every day | ORAL | Status: DC
  Administered 2021-04-22: 200 mg via ORAL
  Filled 2021-04-21: qty 2

## 2021-04-21 MED ORDER — INSULIN DETEMIR 100 UNIT/ML ~~LOC~~ SOLN
23.0000 [IU] | Freq: Every day | SUBCUTANEOUS | Status: DC
  Administered 2021-04-22: 23 [IU] via SUBCUTANEOUS
  Filled 2021-04-21: qty 0.23

## 2021-04-21 MED ORDER — RISPERIDONE 2 MG PO TABS
2.0000 mg | ORAL_TABLET | Freq: Every day | ORAL | Status: DC
  Administered 2021-04-22: 2 mg via ORAL
  Filled 2021-04-21: qty 1

## 2021-04-21 MED ORDER — INSULIN ASPART 100 UNIT/ML ~~LOC~~ SOLN
12.0000 [IU] | Freq: Three times a day (TID) | SUBCUTANEOUS | Status: DC
Start: 2021-04-22 — End: 2021-04-22
  Administered 2021-04-22 (×2): 12 [IU] via SUBCUTANEOUS

## 2021-04-21 MED ORDER — METFORMIN HCL 500 MG PO TABS
500.0000 mg | ORAL_TABLET | Freq: Two times a day (BID) | ORAL | Status: DC
Start: 2021-04-22 — End: 2021-04-22
  Administered 2021-04-22: 500 mg via ORAL
  Filled 2021-04-21: qty 1

## 2021-04-21 NOTE — ED Notes (Signed)
Pt belongings collected and labeled. Pink backpack and one pt belonging bag in triage nurses station. Pt changed out and wanded by security.

## 2021-04-21 NOTE — ED Triage Notes (Signed)
Pt arrives via [employer] with IVC paperwork taken out by her caretaker at group home. Pt is a threat to self and others.

## 2021-04-21 NOTE — ED Provider Notes (Signed)
[facility]-EMERGENCY DEPT Provider Note   CSN: [id number] Arrival date & time: 04/21/21  1710     History No chief complaint on file.   Alexandria Reynolds is a 30 y.o. female with a history of bipolar disorder, schizoaffective disorder, disorder of intellectual development, ADHD, diabetes mellitus type 2.  Presents with a chief complaint of psych evaluation.  Patient had IVC paperwork taken out against her by take her at group home.  IVC paperwork outlining outpatient reported that she " wants to die and will hurt anyone who gets in her way."  Reports that yesterday she eloped from her group home because she was upset at a staff member.  She denies that she got aggressive with staff member.  Patient did not let her group home know where she was staying overnight.  That she did miss her morning medications.  Denies any drug or alcohol use.  Patient denies any suicidal ideations, homicidal ideations, auditory hallucinations, or visual hallucinations.  HPI     Past Medical History:  Diagnosis Date  . ADHD (attention deficit hyperactivity disorder)   . Bipolar disorder (HCC)   . Depression   . Diabetes mellitus, type II (HCC)   . Disorder of intellectual development   . Schizoaffective disorder [facility])     Patient Active Problem List   Diagnosis Date Noted  . Behavior concern in adult   . Schizoaffective disorder (HCC) 07/09/2020  . 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 Developmental disability 08/01/2016  . Bipolar I disorder (HCC) 03/28/2016  . Bipolar affective disorder (HCC) 02/15/2016  . Schizophrenia (HCC) 02/15/2016  . Asthma, mild intermittent 09/19/2013  . Absence of menstruation 07/08/2013  . Type 2 diabetes mellitus (HCC) 07/08/2013  . H/O infectious disease 07/08/2013  . Adiposity 07/08/2013  . Current smoker 07/08/2013    History reviewed. No pertinent surgical history.   OB History   No  obstetric history on file.     Family History  Problem Relation Age of Onset  . Drug abuse Mother   . Alcohol abuse Mother   . Alcohol abuse Brother   . Drug abuse Brother   . Alcohol abuse Brother     Social History   Tobacco Use  . Smoking status: Current Every Day Smoker    Packs/day: 0.05    Types: Cigarettes    Start date: 02/14/2014  . Smokeless tobacco: Never Used  Vaping Use  . Vaping Use: Never used  Substance Use Topics  . Alcohol use: No    Alcohol/week: 0.0 standard drinks  . Drug use: No    Home Medications Prior to Admission medications   Medication Sig Start Date End Date Taking? Authorizing Provider  benztropine (COGENTIN) 0.5 MG tablet Take 1 tablet (0.5 mg total) by mouth 2 (two) times daily as needed for tremors. 08/21/20   Charm Rings, NP  chlorproMAZINE (THORAZINE) 100 MG tablet Take 1 tablet (100 mg total) by mouth at bedtime. 07/09/20   Clapacs, Jackquline Denmark, MD  DULoxetine (CYMBALTA) 20 MG capsule Take 1 capsule (20 mg total) by mouth daily. 08/22/20   Charm Rings, NP  hydrOXYzine (ATARAX/VISTARIL) 25 MG tablet Take 25 mg by mouth daily. 03/26/21   [provider]  insulin aspart (NOVOLOG) 100 UNIT/ML injection Inject 12 Units into the skin 3 (three) times daily with meals. 07/09/20   Clapacs, Jackquline Denmark, MD  insulin detemir (LEVEMIR) 100 UNIT/ML injection Inject 0.23 mLs (23 Units total)  into the skin at bedtime. 07/09/20   Clapacs, Jackquline Denmark, MD  lamoTRIgine (LAMICTAL) 100 MG tablet Take 100 mg by mouth 2 (two) times daily. 03/25/21   [provider]  lamoTRIgine (LAMICTAL) 200 MG tablet Take 1 tablet (200 mg total) by mouth daily. 07/09/20   Clapacs, Jackquline Denmark, MD  loratadine (CLARITIN) 10 MG tablet Take 1 tablet (10 mg total) by mouth daily. 07/10/20   Clapacs, Jackquline Denmark, MD  LORazepam (ATIVAN) 0.5 MG tablet Take 0.5 mg by mouth 2 (two) times daily as needed. 04/13/21   [provider]  LORazepam (ATIVAN) 1 MG tablet Take 1 tablet (1 mg  total) by mouth daily as needed. 07/09/20   Clapacs, Jackquline Denmark, MD  lurasidone (LATUDA) 80 MG TABS tablet Take 1 tablet (80 mg total) by mouth at bedtime. 07/09/20   Clapacs, Jackquline Denmark, MD  metFORMIN (GLUCOPHAGE) 1000 MG tablet Take 1 tablet by mouth 2 (two) times daily. 04/01/21   [provider]  metFORMIN (GLUCOPHAGE) 500 MG tablet Take 1 tablet (500 mg total) by mouth 2 (two) times daily with a meal. 07/09/20   Clapacs, Jackquline Denmark, MD  metoCLOPramide (REGLAN) 10 MG tablet Take 10 mg by mouth at bedtime. 07/28/20   [provider]  MYRBETRIQ 50 MG TB24 tablet Take 50 mg by mouth daily. 07/28/20   [provider]  potassium chloride (KLOR-CON) 10 MEQ tablet Take 1 tablet (10 mEq total) by mouth daily. 08/17/20   Willy Eddy, MD  risperiDONE (RISPERDAL) 2 MG tablet Take 1 tablet (2 mg total) by mouth at bedtime. 08/21/20   Charm Rings, NP  sertraline (ZOLOFT) 100 MG tablet Take by mouth. 04/01/21   [provider]  TRADJENTA 5 MG TABS tablet Take 5 mg by mouth daily. 04/01/21   [provider]  traZODone (DESYREL) 100 MG tablet Take 1 tablet (100 mg total) by mouth at bedtime. 07/09/20   Clapacs, Jackquline Denmark, MD  traZODone (DESYREL) 150 MG tablet Take 150 mg by mouth at bedtime. 04/01/21   [provider]    Allergies    Penicillins  Review of Systems   Review of Systems  Constitutional: Negative for chills and fever.  HENT: Negative for congestion, rhinorrhea and sore throat.   Eyes: Negative for visual disturbance.  Respiratory: Negative for shortness of breath.   Cardiovascular: Negative for chest pain.  Gastrointestinal: Negative for abdominal pain, nausea and vomiting.  Musculoskeletal: Negative for back pain and neck pain.  Skin: Negative for color change and rash.  Neurological: Negative for dizziness, syncope, light-headedness and headaches.  Psychiatric/Behavioral: Negative for confusion, hallucinations, self-injury and suicidal ideas.     Physical Exam Updated Vital Signs There were no vitals taken for this visit.  Physical Exam Vitals and nursing note reviewed.  Constitutional:      General: She is not in acute distress.    Appearance: She is not ill-appearing, toxic-appearing or diaphoretic.  HENT:     Head: Normocephalic.  Eyes:     General: No scleral icterus.       Right eye: No discharge.        Left eye: No discharge.  Cardiovascular:     Rate and Rhythm: Normal rate.     Heart sounds: Normal heart sounds.  Pulmonary:     Effort: Pulmonary effort is normal. No respiratory distress.     Breath sounds: Normal breath sounds. No stridor.  Abdominal:     General: There is no distension. There  are no signs of injury.     Palpations: Abdomen is soft.     Tenderness: There is no abdominal tenderness.  Musculoskeletal:     Cervical back: Neck supple.     Comments: No lacerations to bilateral upper and lower extremities  Skin:    General: Skin is warm and dry.     Coloration: Skin is not jaundiced or pale.  Neurological:     General: No focal deficit present.     Mental Status: She is alert.     GCS: GCS eye subscore is 4. GCS verbal subscore is 5. GCS motor subscore is 6.  Psychiatric:        Attention and Perception: She is attentive. She does not perceive auditory or visual hallucinations.        Speech: Speech normal.        Behavior: Behavior is cooperative.        Thought Content: Thought content is not paranoid or delusional. Thought content does not include homicidal or suicidal ideation. Thought content does not include homicidal or suicidal plan.     ED Results / Procedures / Treatments   Labs (all labs ordered are listed, but only abnormal results are displayed) Labs Reviewed  COMPREHENSIVE METABOLIC PANEL - Abnormal; Notable for the following components:      Result Value   Sodium 133 (*)    Potassium 3.4 (*)    Glucose, Bld 242 (*)    Calcium 8.8 (*)    Total Protein 8.9 (*)     Total Bilirubin 0.2 (*)    All other components within normal limits  SALICYLATE LEVEL - Abnormal; Notable for the following components:   Salicylate Lvl <7.0 (*)    All other components within normal limits  ACETAMINOPHEN LEVEL - Abnormal; Notable for the following components:   Acetaminophen (Tylenol), Serum <10 (*)    All other components within normal limits  RESP PANEL BY RT-PCR (FLU A&B, COVID) ARPGX2  ETHANOL  CBC WITH DIFFERENTIAL/PLATELET  RAPID URINE DRUG SCREEN, HOSP PERFORMED  I-STAT BETA HCG BLOOD, ED ([facility], [facility], [facility] ONLY)    EKG None  Radiology No results found.  Procedures Procedures   Medications Ordered in ED Medications  DULoxetine (CYMBALTA) DR capsule 20 mg (has no administration in time range)  insulin aspart (novoLOG) injection 12 Units (has no administration in time range)  insulin detemir (LEVEMIR) injection 23 Units (has no administration in time range)  lamoTRIgine (LAMICTAL) tablet 200 mg (has no administration in time range)  metFORMIN (GLUCOPHAGE) tablet 500 mg (has no administration in time range)  risperiDONE (RISPERDAL) tablet 2 mg (has no administration in time range)    ED Course  I have reviewed the triage vital signs and the nursing notes.  Pertinent labs & imaging results that were available during my care of the patient were reviewed by me and considered in my medical decision making (see chart for details).    MDM Rules/Calculators/A&P                          Alert 29 year old female no acute distress, nontoxic-appearing.  Patient is here under IVC.  IVC paperwork was taken out by caregiver at group home.  IVC paperwork outlining outpatient reported that she " wants to die and will hurt anyone who gets in her way."  Patient denies any suicidal ideations, homicidal ideations, auditory hallucinations, or visual hallucinations.  Patient denies any drug or alcohol use.  Patient's glucose elevated at 242, bicarb and anion gap within normal  limits, low suspicion for DKA at this time.  Patient medically cleared at this time.  Will place consult for TTS.  Patient's home medications ordered.  Patient awaiting TTS.  Final Clinical Impression(s) / ED Diagnoses Final diagnoses:  None    Rx / DC Orders ED Discharge Orders    None       Berneice HeinrichBadalamente, Fares Ramthun R, PA-C 04/21/21 2251    Vanetta MuldersZackowski, Scott, MD 04/23/21 0110

## 2021-04-21 NOTE — ED Notes (Signed)
Pt belongings placed in cabinet designated for res A and rooms 16-18

## 2021-04-22 LAB — RAPID URINE DRUG SCREEN, HOSP PERFORMED
Amphetamines: NOT DETECTED
Barbiturates: NOT DETECTED
Benzodiazepines: NOT DETECTED
Cocaine: NOT DETECTED
Opiates: NOT DETECTED
Tetrahydrocannabinol: NOT DETECTED

## 2021-04-22 LAB — CBG MONITORING, ED
Glucose-Capillary: 189 mg/dL — ABNORMAL HIGH (ref 70–99)
Glucose-Capillary: 190 mg/dL — ABNORMAL HIGH (ref 70–99)

## 2021-04-22 MED ORDER — METFORMIN HCL 500 MG PO TABS: 1000.0000 mg | ORAL_TABLET | Freq: Two times a day (BID) | ORAL | Status: DC

## 2021-04-22 MED ORDER — RISPERIDONE 1 MG PO TABS
1.0000 mg | ORAL_TABLET | Freq: Every day | ORAL | Status: DC
  Administered 2021-04-22: 1 mg via ORAL
  Filled 2021-04-22: qty 1

## 2021-04-22 MED ORDER — RISPERDAL 2 MG PO TABS: 2.0000 mg | ORAL_TABLET | Freq: Every day | ORAL | 0 refills | Status: DC

## 2021-04-22 MED ORDER — RISPERDAL 1 MG PO TABS: 1.0000 mg | ORAL_TABLET | Freq: Every day | ORAL | 0 refills | Status: DC

## 2021-04-22 NOTE — Consult Note (Addendum)
Telepsych Consultation   Reason for Consult:  Psych consult Referring Physician:  Grace Bushy, MD Location of Patient: [place] [facility] Location of Provider: Behavioral Health TTS Department  Patient Identification: Alexandria Reynolds MRN:  [id number] Principal Diagnosis: Adjustment disorder with mixed disturbance of emotions and conduct Diagnosis:  Principal Problem:   Adjustment disorder with mixed disturbance of emotions and conduct Active Problems:   Developmental disability   Schizoaffective disorder (HCC)   Behavior concern in adult   Total Time spent with patient: 20 minutes  Subjective:   Alexandria Reynolds is a 30 y.o. female patient admitted via IVC allegedly after making suicidal statements at her group home.   "I had walked off Wednesday afternoon about 2-3 and I didn't come back until the next day around 3 or 4. I spent the night at someone's house. I got angry at one of the staff bc when someone lies on me it stays on my mind for 2 or 3 weeks saying I stole $20. Then she said some things to me calling me a compulsive liar so that's when I left and stayed gone".   Patient states she has been taking her medication except for days she has been gone. Says she feels Alexandria Reynolds (group home staff member) doesn't like her and is trying to get people not to like her. She denies any suicidal or homicidal ideations, auditory or visual hallucinations, and does not appear to be responding to any external/internal. Patient states she feels she can be safe at the group home and verbalized an understanding of the concerns about her leaving. Patient states her plan to use coping skills of: smoking a cigarette outside and listening to music in her room upon return, says "Alexandria Reynolds" told her to call her whenever she is upset and feels like she wants to leave; patient agrees with plan.   HPI:   Alexandria Reynolds is a 30 year old female with a history of bipolar disorder, schizoaffective disorder, IDD, ADHD,  DM2 who presented to [facility] via IVC from family care home. Patient reportedly eloped 04/20/21 overnight after a disagreement with a staff member and again on 04/18/21. Patient has guardian [name] [phone]). Released from [facility] 03/28/21 after 8-9 months.   Past Psychiatric History:   -bipolar disorder  -schizoaffective disorder  -IDD  -ADHD  Risk to Self:  pt denies Risk to Others:  pt denies Prior Inpatient Therapy:  yes Prior Outpatient Therapy:  yes  Past Medical History:  Past Medical History:  Diagnosis Date  . ADHD (attention deficit hyperactivity disorder)   . Bipolar disorder (HCC)   . Depression   . Diabetes mellitus, type II (HCC)   . Disorder of intellectual development   . Schizoaffective disorder (HCC)    History reviewed. No pertinent surgical history. Family History:  Family History  Problem Relation Age of Onset  . Drug abuse Mother   . Alcohol abuse Mother   . Alcohol abuse Brother   . Drug abuse Brother   . Alcohol abuse Brother    Family Psychiatric  History: not noted Social History:  Social History   Substance and Sexual Activity  Alcohol Use No  . Alcohol/week: 0.0 standard drinks     Social History   Substance and Sexual Activity  Drug Use No    Social History   Socioeconomic History  . Marital status: Single    Spouse name: Not on file  . Number of children: Not on file  . Years of education:  Not on file  . Highest education level: Not on file  Occupational History  . Not on file  Tobacco Use  . Smoking status: Current Every Day Smoker    Packs/day: 0.05    Types: Cigarettes    Start date: 02/14/2014  . Smokeless tobacco: Never Used  Vaping Use  . Vaping Use: Never used  Substance and Sexual Activity  . Alcohol use: No    Alcohol/week: 0.0 standard drinks  . Drug use: No  . Sexual activity: Never  Other Topics Concern  . Not on file  Social History Narrative  . Not on file   Social Determinants of Health    Financial Resource Strain: Not on file  Food Insecurity: Not on file  Transportation Needs: Not on file  Physical Activity: Not on file  Stress: Not on file  Social Connections: Not on file   Additional Social History:    Allergies:   Allergies  Allergen Reactions  . Penicillins Rash    Did it involve swelling of the face/tongue/throat, SOB, or low BP? No Did it involve sudden or severe rash/hives, skin peeling, or any reaction on the inside of your mouth or nose? Yes Did you need to seek medical attention at a hospital or doctor's office? No When did it last happen? If all above answers are "NO", may proceed with cephalosporin use.    Labs:  Results for orders placed or performed during the hospital encounter of 04/21/21 (from the past 48 hour(s))  Resp Panel by RT-PCR (Flu A&B, Covid) Nasopharyngeal Swab     Status: None   Collection Time: 04/21/21  5:49 PM   Specimen: Nasopharyngeal Swab; Nasopharyngeal(NP) swabs in vial transport medium  Result Value Ref Range   SARS Coronavirus 2 by RT PCR NEGATIVE NEGATIVE    Comment: (NOTE) SARS-CoV-2 target nucleic acids are NOT DETECTED.  The SARS-CoV-2 RNA is generally detectable in upper respiratory specimens during the acute phase of infection. The lowest concentration of SARS-CoV-2 viral copies this assay can detect is 138 copies/mL. A negative result does not preclude SARS-Cov-2 infection and should not be used as the sole basis for treatment or other patient management decisions. A negative result may occur with  improper specimen collection/handling, submission of specimen other than nasopharyngeal swab, presence of viral mutation(s) within the areas targeted by this assay, and inadequate number of viral copies(<138 copies/mL). A negative result must be combined with clinical observations, patient history, and epidemiological information. The expected result is Negative.  Fact Sheet for Patients:   BloggerCourse.com  Fact Sheet for Healthcare Providers:  SeriousBroker.it  This test is no t yet approved or cleared by the [place] FDA and  has been authorized for detection and/or diagnosis of SARS-CoV-2 by FDA under an Emergency Use Authorization (EUA). This EUA will remain  in effect (meaning this test can be used) for the duration of the COVID-19 declaration under Section 564(b)(1) of the Act, 21 U.S.C.section 360bbb-3(b)(1), unless the authorization is terminated  or revoked sooner.       Influenza A by PCR NEGATIVE NEGATIVE   Influenza B by PCR NEGATIVE NEGATIVE    Comment: (NOTE) The Xpert Xpress SARS-CoV-2/FLU/RSV plus assay is intended as an aid in the diagnosis of influenza from Nasopharyngeal swab specimens and should not be used as a sole basis for treatment. Nasal washings and aspirates are unacceptable for Xpert Xpress SARS-CoV-2/FLU/RSV testing.  Fact Sheet for Patients: BloggerCourse.com  Fact Sheet for Healthcare Providers: SeriousBroker.it  This test is  not yet approved or cleared by the [place] and has been authorized for detection and/or diagnosis of SARS-CoV-2 by FDA under an Emergency Use Authorization (EUA). This EUA will remain in effect (meaning this test can be used) for the duration of the COVID-19 declaration under Section 564(b)(1) of the Act, 21 U.S.C. section 360bbb-3(b)(1), unless the authorization is terminated or revoked.  Performed at [facility], [address], [place], [place] [address]   Comprehensive metabolic panel     Status: Abnormal   Collection Time: 04/21/21  6:12 PM  Result Value Ref Range   Sodium 133 (L) 135 - 145 mmol/L   Potassium 3.4 (L) 3.5 - 5.1 mmol/L   Chloride 101 98 - 111 mmol/L   CO2 23 22 - 32 mmol/L   Glucose, Bld 242 (H) 70 - 99 mg/dL    Comment: Glucose reference range  applies only to samples taken after fasting for at least 8 hours.   BUN 13 6 - 20 mg/dL   Creatinine, Ser 3.08 0.44 - 1.00 mg/dL   Calcium 8.8 (L) 8.9 - 10.3 mg/dL   Total Protein 8.9 (H) 6.5 - 8.1 g/dL   Albumin 4.2 3.5 - 5.0 g/dL   AST 25 15 - 41 U/L   ALT 25 0 - 44 U/L   Alkaline Phosphatase 48 38 - 126 U/L   Total Bilirubin 0.2 (L) 0.3 - 1.2 mg/dL   GFR, Estimated >65 >78 mL/min    Comment: (NOTE) Calculated using the CKD-EPI Creatinine Equation (2021)    Anion gap 9 5 - 15    Comment: Performed at [facility], [address] [place], [place] [address]  Ethanol     Status: None   Collection Time: 04/21/21  6:12 PM  Result Value Ref Range   Alcohol, Ethyl (B) <10 <10 mg/dL    Comment: (NOTE) Lowest detectable limit for serum alcohol is 10 mg/dL.  For medical purposes only. Performed at [facility], [address], [place], [place] [address]   CBC with Diff     Status: None   Collection Time: 04/21/21  6:12 PM  Result Value Ref Range   WBC 6.1 4.0 - 10.5 K/uL   RBC 4.70 3.87 - 5.11 MIL/uL   Hemoglobin 13.0 12.0 - 15.0 g/dL   HCT 13.2 44.0 - 10.2 %   MCV 84.7 80.0 - 100.0 fL   MCH 27.7 26.0 - 34.0 pg   MCHC 32.7 30.0 - 36.0 g/dL   RDW 72.5 36.6 - 44.0 %   Platelets 288 150 - 400 K/uL   nRBC 0.0 0.0 - 0.2 %   Neutrophils Relative % 55 %   Neutro Abs 3.3 1.7 - 7.7 K/uL   Lymphocytes Relative 40 %   Lymphs Abs 2.4 0.7 - 4.0 K/uL   Monocytes Relative 5 %   Monocytes Absolute 0.3 0.1 - 1.0 K/uL   Eosinophils Relative 0 %   Eosinophils Absolute 0.0 0.0 - 0.5 K/uL   Basophils Relative 0 %   Basophils Absolute 0.0 0.0 - 0.1 K/uL   Immature Granulocytes 0 %   Abs Immature Granulocytes 0.02 0.00 - 0.07 K/uL    Comment: Performed at [facility], [address], [place], [place] [address]  Salicylate level     Status: Abnormal   Collection Time: 04/21/21  6:12 PM  Result Value Ref Range   Salicylate Lvl <7.0 (L)  7.0 - 30.0 mg/dL  Comment: Performed at [facility], [address] [place], [place] [address]  Acetaminophen level     Status: Abnormal   Collection Time: 04/21/21  6:12 PM  Result Value Ref Range   Acetaminophen (Tylenol), Serum <10 (L) 10 - 30 ug/mL    Comment: (NOTE) Therapeutic concentrations vary significantly. A range of 10-30 ug/mL  may be an effective concentration for many patients. However, some  are best treated at concentrations outside of this range. Acetaminophen concentrations >150 ug/mL at 4 hours after ingestion  and >50 ug/mL at 12 hours after ingestion are often associated with  toxic reactions.  Performed at [facility], [address] [place], [place] [address]   I-Stat beta hCG blood, ED     Status: None   Collection Time: 04/21/21  6:26 PM  Result Value Ref Range   I-stat hCG, quantitative <5.0 <5 mIU/mL   Comment 3            Comment:   GEST. AGE      CONC.  (mIU/mL)   <=1 WEEK        5 - 50     2 WEEKS       50 - 500     3 WEEKS       100 - 10,000     4 WEEKS     1,000 - 30,000        FEMALE AND NON-PREGNANT FEMALE:     LESS THAN 5 mIU/mL   CBG monitoring, ED     Status: Abnormal   Collection Time: 04/22/21  8:28 AM  Result Value Ref Range   Glucose-Capillary 189 (H) 70 - 99 mg/dL    Comment: Glucose reference range applies only to samples taken after fasting for at least 8 hours.  CBG monitoring, ED     Status: Abnormal   Collection Time: 04/22/21 11:12 AM  Result Value Ref Range   Glucose-Capillary 190 (H) 70 - 99 mg/dL    Comment: Glucose reference range applies only to samples taken after fasting for at least 8 hours.    Medications:  Current Facility-Administered Medications  Medication Dose Route Frequency Provider Last Rate Last Admin  . DULoxetine (CYMBALTA) DR capsule 20 mg  20 mg Oral Daily Haskel SchroederBadalamente, Peter R, PA-C   20 mg at 04/22/21 1028  . insulin aspart (novoLOG) injection 12 Units  12  Units Subcutaneous TID WC Haskel SchroederBadalamente, Peter R, PA-C   12 Units at 04/22/21 [id number]  . insulin detemir (LEVEMIR) injection 23 Units  23 Units Subcutaneous QHS Haskel SchroederBadalamente, Peter R, PA-C   23 Units at 04/22/21 0003  . lamoTRIgine (LAMICTAL) tablet 200 mg  200 mg Oral Daily Haskel SchroederBadalamente, Peter R, PA-C   200 mg at 04/22/21 0900  . metFORMIN (GLUCOPHAGE) tablet 1,000 mg  1,000 mg Oral BID WC Leevy-Johnson, Mechel Schutter A, NP      . risperiDONE (RISPERDAL) tablet 1 mg  1 mg Oral Daily Leevy-Johnson, Darina Hartwell A, NP      . risperiDONE (RISPERDAL) tablet 2 mg  2 mg Oral QHS Haskel SchroederBadalamente, Peter R, PA-C   2 mg at 04/22/21 0002   Current Outpatient Medications  Medication Sig Dispense Refill  . benztropine (COGENTIN) 0.5 MG tablet Take 1 tablet (0.5 mg total) by mouth 2 (two) times daily as needed for tremors. 60 tablet 1  . chlorproMAZINE (THORAZINE) 100 MG tablet Take 1 tablet (100 mg total) by mouth at bedtime. 30 tablet 0  . DULoxetine (CYMBALTA)  20 MG capsule Take 1 capsule (20 mg total) by mouth daily. 30 capsule 3  . hydrOXYzine (ATARAX/VISTARIL) 25 MG tablet Take 25 mg by mouth daily.    . insulin aspart (NOVOLOG) 100 UNIT/ML injection Inject 12 Units into the skin 3 (three) times daily with meals. 10 mL 0  . insulin detemir (LEVEMIR) 100 UNIT/ML injection Inject 0.23 mLs (23 Units total) into the skin at bedtime. 10 mL 0  . lamoTRIgine (LAMICTAL) 100 MG tablet Take 100 mg by mouth 2 (two) times daily.    Marland Kitchen lamoTRIgine (LAMICTAL) 200 MG tablet Take 1 tablet (200 mg total) by mouth daily. 30 tablet 0  . loratadine (CLARITIN) 10 MG tablet Take 1 tablet (10 mg total) by mouth daily. 30 tablet 0  . LORazepam (ATIVAN) 0.5 MG tablet Take 0.5 mg by mouth 2 (two) times daily as needed.    Marland Kitchen LORazepam (ATIVAN) 1 MG tablet Take 1 tablet (1 mg total) by mouth daily as needed. 30 tablet 0  . lurasidone (LATUDA) 80 MG TABS tablet Take 1 tablet (80 mg total) by mouth at bedtime. 30 tablet 0  . metFORMIN (GLUCOPHAGE) 1000 MG  tablet Take 1 tablet by mouth 2 (two) times daily.    . metFORMIN (GLUCOPHAGE) 500 MG tablet Take 1 tablet (500 mg total) by mouth 2 (two) times daily with a meal. 60 tablet 0  . metoCLOPramide (REGLAN) 10 MG tablet Take 10 mg by mouth at bedtime.    Marland Kitchen MYRBETRIQ 50 MG TB24 tablet Take 50 mg by mouth daily.    . potassium chloride (KLOR-CON) 10 MEQ tablet Take 1 tablet (10 mEq total) by mouth daily. 7 tablet 0  . risperiDONE (RISPERDAL) 2 MG tablet Take 1 tablet (2 mg total) by mouth at bedtime. 30 tablet 1  . sertraline (ZOLOFT) 100 MG tablet Take by mouth.    . TRADJENTA 5 MG TABS tablet Take 5 mg by mouth daily.    . traZODone (DESYREL) 100 MG tablet Take 1 tablet (100 mg total) by mouth at bedtime. 30 tablet 0  . traZODone (DESYREL) 150 MG tablet Take 150 mg by mouth at bedtime.      Musculoskeletal: Strength & Muscle Tone: within normal limits Gait & Station: normal Patient leans: N/A  Psychiatric Specialty Exam: Physical Exam Vitals and nursing note reviewed.  Psychiatric:        Attention and Perception: Attention and perception normal.        Mood and Affect: Mood and affect normal.        Speech: Speech normal.        Behavior: Behavior normal. Behavior is cooperative.        Thought Content: Thought content normal.        Cognition and Memory: Memory normal. Cognition is impaired.        Judgment: Judgment is impulsive.     Review of Systems  Psychiatric/Behavioral: Negative.   All other systems reviewed and are negative.   Blood pressure 112/63, pulse 74, temperature 98.1 F (36.7 C), temperature source Oral, resp. rate 18, SpO2 98 %.There is no height or weight on file to calculate BMI.  General Appearance: Casual  Eye Contact:  Fair  Speech:  Clear and Coherent  Volume:  Normal  Mood:  Euthymic  Affect:  Congruent  Thought Process:  Goal Directed  Orientation:  Full (Time, Place, and Person)  Thought Content:  WDL and Logical  Suicidal Thoughts:  No   Homicidal Thoughts:  No  Memory:  Immediate;   Fair Recent;   Fair  Judgement:  Other:  hx of IDD; patient presents well, able to verbalize insight  Insight:  Fair, Present and Shallow  Psychomotor Activity:  Normal  Concentration:  Concentration: Fair and Attention Span: Fair  Recall:  [employer] of Knowledge:  Fair  Language:  Fair  Akathisia:  NA  Handed:    AIMS (if indicated):     Assets:  Architect Housing Physical Health Resilience Social Support  ADL's:  Intact  Cognition:  Impaired,  Mild  Sleep:      Treatment Plan Summary: Plan :    -Review MAR, possible medication adjustment   -pt has appt with Dr Jannifer Franklin 05/02/21   -LaTuda 80 mg discontinued; Risperidone 1 mg    daily, Risperidone 2 mg hs started (Risperidone 2    mg HS initiated in ED 04/21/21, pt tolerated.     -Joycelyn Man (group home owner     notified of changes; prescriptions called     into [address], [place])  -Social Work to arrange transportation back to facility  Disposition: No evidence of imminent risk to self or others at present.   Patient does not meet criteria for psychiatric inpatient admission. Supportive therapy provided about ongoing stressors. Discussed crisis plan, support from social network, calling 911, coming to the Emergency Department, and calling Suicide Hotline.  This service was provided via telemedicine using a 2-way, interactive audio and video technology.  Names of all persons participating in this telemedicine service and their role in this encounter. Name: Maxie Barb Role: PMHNP  Name: Nelly Rout Role: Attending MD  Name: Andreia Aline Brochure Role: patient  Name: [name] Name: Joycelyn Man Role: legal guardian Role: group home owner    Loletta Parish, NP 04/22/2021 11:52 AM

## 2021-04-22 NOTE — Progress Notes (Signed)
..   Transition of Care [facility]) - Emergency Department Mini Assessment   Patient Details  Name: Alexandria Reynolds MRN: [id number] Date of Birth: [birth year]  Transition of Care [facility] CM/SW Contact:    Alyus Mofield C Tarpley-Carter, LCSWA Phone Number: 04/22/2021, 9:46 AM   Clinical Narrative: TOC CSW attempted to contact Brandy Horton at [phone].  The number given for [name] is the incorrect number.  This number belongs to someone else.  Jashon Ishida Tarpley-Carter, MSW, LCSW-A Pronouns:  She, Her, Hers                  [name] ED Transitions of CareClinical Social Worker [email or web address] [phone]   ED Mini Assessment: What brought you to the Emergency Department? : Psych Eval  Barriers to Discharge: No Barriers Identified     Means of departure: Police  Interventions which prevented an admission or readmission: Other (must enter comment) (Contact Group Home)    Patient Contact and Communications Key Contact 1: [name]   Spoke with: Ms. Max Fickle Date: 04/22/21,     Contact Phone Number: [phone] Call outcome: The number given for [name] is the incorrect number.      Choice offered to / list presented to : NA  Admission diagnosis:  IVC Patient Active Problem List   Diagnosis Date Noted  . Behavior concern in adult   . Schizoaffective disorder (HCC) 07/09/2020  . 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 Developmental disability 08/01/2016  . Bipolar I disorder (HCC) 03/28/2016  . Bipolar affective disorder (HCC) 02/15/2016  . Schizophrenia (HCC) 02/15/2016  . Asthma, mild intermittent 09/19/2013  . Absence of menstruation 07/08/2013  . Type 2 diabetes mellitus (HCC) 07/08/2013  . H/O infectious disease 07/08/2013  . Adiposity 07/08/2013  . Current smoker 07/08/2013   PCP:  Galvin Proffer, MD Pharmacy:   [facility] - [place], [place] - [address]  [address]  [place] [address] Phone: [phone] Fax: [phone]

## 2021-04-22 NOTE — BH Assessment (Signed)
Disposition Nira Conn, NP, disposition pending collateral contact. TTS clinician made several attempts, unable to reach [name], petitioner/group home staff member for additional information. TTS will make attempts at later time. Everardo Pacific, RN, updated of patient status.

## 2021-04-22 NOTE — Consult Note (Signed)
  Collateral:  [name] (legal guardian- [employer] [facility]): [phone] Expressed concerns regarding patient's behaviors at the home. States there were suspicions the medications weren't effective. States patient has been at the group home 2.5 weeks and police had to go looking for her. Patient spent 8-9 months at [facility]; prior to patient had presented with similar presentation at prior placements. States he is concerned that was stabilized at the hospital then began "immediately" with behaviors (eloping, wandering off with strangers, threatening staff, etc.). States patient has a discharge a notice from the current facility for 05/27/21; but patient is able to return for now.    Joycelyn Man (group home owner) [phone] States the only medication change was an addition of Ativan. Says patient has been with her agency for 5 years; was previously in a group home through the agency prior to [facility] stay and the behaviors are not new. Says patient has not had any medication changes in years other than minor adjustments. States patient had behaviors maybe once a year for 4 years; says behaviors increased since March 2021 with no known cause. Says patient is documented to be medication compliant. States patient does not have a current medication management provider as she was dismissed from a practice in [place] once admitted to [facility] and has [employer]. Patient was seen by Vesta Mixer ([place]) 03/30/21 for a follow-up post-discharge from [facility] (03/28/21); no medications were changed at this appointment. . Has appointment with Dr Jannifer Franklin 05/02/21 if patient's Medicaid can be switched over by 04/27/21. Says she will fax over patient's [facility] for review and possible medication adjustments. Provider discussed admission criteria and patient's presentation since admission to ED, explained that patient doesn't meet inpatient criteria at this time however acknowledged her concerns that patient  appears to require a higher level of care long-term. She acknowledged that as a result she is in the process of discharging the patient from the facility effective 05/27/21.   Plan:   -Review MAR   -make possible medication adjustments   -Refer to SW to arrange transportation for discharge.

## 2021-04-22 NOTE — Progress Notes (Signed)
TOC CSW contacted Alexandria Reynolds/legal guardian [phone].  Alexandria Manchester stated he received a text from Alexandria Reynolds stating that she was in doctors appt and would pick pt up afterwards.  CSW will attempt to contact Alexandria Reynolds again for confirmation.  Alexandria Reynolds, MSW, LCSW-A Pronouns:  She, Her, Hers                  [name] ED Transitions of CareClinical Social Worker [name] [phone]
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
## 2021-04-22 NOTE — ED Provider Notes (Signed)
Patient evaluated by psychiatry team.  Cleared for further outpatient psychiatric care.  Advised immediate return for worsening symptoms or any additional concerns.    Cheryll Cockayne, MD 04/22/21 1214

## 2021-04-22 NOTE — Discharge Instructions (Signed)
For your behavioral health needs, you are advised to follow up with your regular outpatient provider.  If you do not have a provider at this time, contact [facility]:       [facility]      [address]      [place], [place] [address]      [phone]      They offer psychiatry/medication management, therapy and substance abuse treatment.  New patients are being seen in their walk-in clinic.  Walk-in hours are Monday - Thursday from 8:00 am - 11:00 am for psychiatry, and Friday from 1:00 pm - 4:00 pm for therapy.  Walk-in patients are seen on a first come, first served basis, so try to arrive as early as possible for the best chance of being seen the same day.

## 2021-04-22 NOTE — BH Assessment (Signed)
[facility] Assessment Progress Note  Per Nelly Rout, MD, this pt does not require psychiatric hospitalization at this time.  Pt presents under IVC initiated by staff at her residential facility and upheld by EDP Vanetta Mulders, MD, which has been rescinded by Dr Lucianne Muss.  Pt is psychiatrically cleared.  Discharge instructions include recommendation to continue treatment with her current outpatient provider, as well as information for [facility] as an alternative.  EDP Norman Clay, MD and pt's nurse, Tobi Bastos, have been notified.  Doylene Canning, MA Triage Specialist [phone]

## 2021-04-22 NOTE — BH Assessment (Signed)
Disposition Nira Conn, NP, disposition pending collateral contact. TTS clinician made several attempts, unable to reach [name], petitioner/group home staff member for additional information. TTS will make attempts at later time.

## 2021-04-22 NOTE — Progress Notes (Signed)
TOC CSW attempted to contact [facility] owner [phone].  CSW left HIPPA compliant message with my contact information.  Alexandria Reynolds, MSW, LCSW-A Pronouns:  She, Her, Hers                  [name] Transitions of CareClinical Social Worker [email or web address] [phone]

## 2021-06-14 ENCOUNTER — Emergency Department (HOSPITAL_COMMUNITY)
Admission: EM | Admit: 2021-06-14 | Discharge: 2021-06-14 | Disposition: A | Discharge status: Home / Self Care | Payer: Medicare Other | Attending: Emergency Medicine | Admitting: Emergency Medicine

## 2021-06-14 ENCOUNTER — Encounter (HOSPITAL_COMMUNITY): Payer: Self-pay | Admitting: Student

## 2021-06-14 ENCOUNTER — Other Ambulatory Visit: Payer: Self-pay

## 2021-06-14 DIAGNOSIS — Z794 Long term (current) use of insulin: Secondary | ICD-10-CM | POA: Diagnosis not present

## 2021-06-14 DIAGNOSIS — J4520 Mild intermittent asthma, uncomplicated: Secondary | ICD-10-CM | POA: Diagnosis not present

## 2021-06-14 DIAGNOSIS — U071 COVID-19: Secondary | ICD-10-CM | POA: Diagnosis not present

## 2021-06-14 DIAGNOSIS — Z79899 Other long term (current) drug therapy: Secondary | ICD-10-CM | POA: Insufficient documentation

## 2021-06-14 DIAGNOSIS — F17210 Nicotine dependence, cigarettes, uncomplicated: Secondary | ICD-10-CM | POA: Diagnosis not present

## 2021-06-14 DIAGNOSIS — E1165 Type 2 diabetes mellitus with hyperglycemia: Secondary | ICD-10-CM | POA: Diagnosis not present

## 2021-06-14 DIAGNOSIS — Z7984 Long term (current) use of oral hypoglycemic drugs: Secondary | ICD-10-CM | POA: Insufficient documentation

## 2021-06-14 LAB — CBC WITH DIFFERENTIAL/PLATELET
Abs Immature Granulocytes: 0.03 10*3/uL (ref 0.00–0.07)
Basophils Absolute: 0 10*3/uL (ref 0.0–0.1)
Basophils Relative: 0 %
Eosinophils Absolute: 0 10*3/uL (ref 0.0–0.5)
Eosinophils Relative: 0 %
HCT: 42.7 % (ref 36.0–46.0)
Hemoglobin: 14.3 g/dL (ref 12.0–15.0)
Immature Granulocytes: 0 %
Lymphocytes Relative: 40 %
Lymphs Abs: 2.9 10*3/uL (ref 0.7–4.0)
MCH: 27.9 pg (ref 26.0–34.0)
MCHC: 33.5 g/dL (ref 30.0–36.0)
MCV: 83.4 fL (ref 80.0–100.0)
Monocytes Absolute: 0.4 10*3/uL (ref 0.1–1.0)
Monocytes Relative: 5 %
Neutro Abs: 3.9 10*3/uL (ref 1.7–7.7)
Neutrophils Relative %: 55 %
Platelets: 271 10*3/uL (ref 150–400)
RBC: 5.12 MIL/uL — ABNORMAL HIGH (ref 3.87–5.11)
RDW: 12.9 % (ref 11.5–15.5)
WBC: 7.2 10*3/uL (ref 4.0–10.5)
nRBC: 0 % (ref 0.0–0.2)

## 2021-06-14 LAB — URINALYSIS, ROUTINE W REFLEX MICROSCOPIC
Bacteria, UA: NONE SEEN
Bilirubin Urine: NEGATIVE
Glucose, UA: >=500 mg/dL — AB
Ketones, ur: NEGATIVE mg/dL
Leukocytes,Ua: NEGATIVE
Nitrite: NEGATIVE
Protein, ur: NEGATIVE mg/dL
Specific Gravity, Urine: 1.025 (ref 1.005–1.030)
pH: 5 (ref 5.0–8.0)

## 2021-06-14 LAB — COMPREHENSIVE METABOLIC PANEL
ALT: 30 U/L (ref 0–44)
AST: 31 U/L (ref 15–41)
Albumin: 4.5 g/dL (ref 3.5–5.0)
Alkaline Phosphatase: 71 U/L (ref 38–126)
Anion gap: 14 (ref 5–15)
BUN: 13 mg/dL (ref 6–20)
CO2: 20 mmol/L — ABNORMAL LOW (ref 22–32)
Calcium: 10 mg/dL (ref 8.9–10.3)
Chloride: 93 mmol/L — ABNORMAL LOW (ref 98–111)
Creatinine, Ser: 0.78 mg/dL (ref 0.44–1.00)
GFR, Estimated: >60 mL/min (ref >60)
Glucose, Bld: 585 mg/dL (ref 70–99)
Potassium: 4.4 mmol/L (ref 3.5–5.1)
Sodium: 127 mmol/L — ABNORMAL LOW (ref 135–145)
Total Bilirubin: 0.5 mg/dL (ref 0.3–1.2)
Total Protein: 9.6 g/dL — ABNORMAL HIGH (ref 6.5–8.1)

## 2021-06-14 LAB — LIPASE, BLOOD: Lipase: 50 U/L (ref 11–51)

## 2021-06-14 LAB — I-STAT BETA HCG BLOOD, ED (MC, WL, AP ONLY): I-stat hCG, quantitative: <5 m[IU]/mL (ref <5)

## 2021-06-14 LAB — CBG MONITORING, ED: Glucose-Capillary: 321 mg/dL — ABNORMAL HIGH (ref 70–99)

## 2021-06-14 MED ORDER — SODIUM CHLORIDE 0.9 % IV BOLUS
2000.0000 mL | Freq: Once | INTRAVENOUS | Status: AC
  Administered 2021-06-14: 2000 mL via INTRAVENOUS

## 2021-06-14 MED ORDER — GUAIFENESIN 100 MG/5ML PO SOLN: 5.0000 mL | ORAL | 0 refills | Status: DC | PRN

## 2021-06-14 MED ORDER — INSULIN ASPART 100 UNIT/ML IJ SOLN
10.0000 [IU] | Freq: Once | INTRAMUSCULAR | Status: AC
  Administered 2021-06-14: 10 [IU] via SUBCUTANEOUS
  Filled 2021-06-14: qty 0.1

## 2021-06-14 MED ORDER — ONDANSETRON HCL 4 MG/2ML IJ SOLN
4.0000 mg | Freq: Once | INTRAMUSCULAR | Status: DC
  Filled 2021-06-14: qty 2

## 2021-06-14 MED ORDER — ONDANSETRON HCL 4 MG/2ML IJ SOLN: 4.0000 mg | Freq: Once | INTRAMUSCULAR | Status: DC

## 2021-06-14 MED ORDER — LOPERAMIDE HCL 2 MG PO CAPS: 2.0000 mg | ORAL_CAPSULE | Freq: Four times a day (QID) | ORAL | 0 refills | Status: DC | PRN

## 2021-06-14 MED ORDER — SODIUM CHLORIDE 0.9 % IV BOLUS: 1000.0000 mL | Freq: Once | INTRAVENOUS | Status: DC

## 2021-06-14 NOTE — ED Provider Notes (Signed)
Emergency Medicine Provider Triage Evaluation Note  Alexandria Reynolds , a 30 y.o. female  was evaluated in triage.  Pt complains of abdominal pain and nonbloody diarrhea.  Patient tested positive for COVID on Wednesday.  No previous abdominal operations.  No nausea or vomiting.  Patient denies any chest pain or shortness of breath.  Patient admits to vaginal bleeding however, is concerned she could be pregnant.  Review of Systems  Positive: Abdominal bleeding, diarrhea Negative: CP  Physical Exam  BP (!) 156/97 (BP Location: Left Arm)   Pulse (!) 111   Temp 98.3 F (36.8 C) (Oral)   Resp 16   Ht 5\' 7"  (1.702 m)   Wt 97.9 kg   LMP 05/23/2021   SpO2 98%   BMI 33.80 kg/m  Gen:   Awake, no distress   Resp:  Normal effort MSK:   Moves extremities without difficulty  Other:    Medical Decision Making  Medically screening exam initiated at 4:40 PM.  Appropriate orders placed.  Alexandria Reynolds was informed that the remainder of the evaluation will be completed by another provider, this initial triage assessment does not replace that evaluation, and the importance of remaining in the ED until their evaluation is complete.  Abdominal labs ordered. EKG due to tachycardia   Aundria Rud 06/14/21 1642    06/16/21, MD 06/14/21 [phone]

## 2021-06-14 NOTE — ED Provider Notes (Signed)
[facility]-EMER[facility]ENCY DEPT Provider Note   CSN: [id number] Arrival date & time: 06/14/21  1510     History Chief Complaint  Patient presents with   Abdominal Pain   Diarrhea   Cough   Covid Positive    Alexandria Reynolds is a 30 y.o. female.  She is COVID-19 positive and is on day 8 of illness.  She presents with ongoing coughing and diarrhea.  She has not taken anything for her symptoms.  The history is provided by the patient.  Diarrhea Quality:  Copious Severity:  Moderate Onset quality:  Gradual Number of episodes:  3 Duration:  8 days Timing:  Intermittent Progression:  Unchanged Relieved by:  Nothing Worsened by:  Nothing Ineffective treatments:  None tried Associated symptoms: abdominal pain ("like something is moving"), cough and URI   Associated symptoms: no arthralgias, no chills, no fever and no vomiting   Cough Associated symptoms: no chest pain, no chills, no ear pain, no fever, no rash, no shortness of breath and no sore throat       Past Medical History:  Diagnosis Date   ADHD (attention deficit hyperactivity disorder)    Bipolar disorder (HCC)    Depression    Diabetes mellitus, type II (HCC)    Disorder of intellectual development    Schizoaffective disorder [facility])     Patient Active Problem List   Diagnosis Date Noted   Behavior concern in adult    Schizoaffective disorder (HCC) 07/09/2020   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 No pertinent surgical history.   OB History   No obstetric history on  file.     Family History  Problem Relation Age of Onset   Drug abuse Mother    Alcohol abuse Mother    Alcohol abuse Brother    Drug abuse Brother    Alcohol abuse Brother     Social History   Tobacco Use   Smoking status: Every Day    Packs/day: 0.05    Types: Cigarettes    Start date: 02/14/2014   Smokeless tobacco: Never  Vaping Use   Vaping Use: Never used  Substance Use Topics   Alcohol use: No    Alcohol/week: 0.0 standard drinks   Drug use: No    Home Medications Prior to Admission medications   Medication Sig Start Date End Date Taking? Authorizing Provider  benztropine (COGENTIN) 0.5 MG tablet Take 1 tablet (0.5 mg total) by mouth 2 (two) times daily as needed for tremors. 08/21/20   Charm Rings, NP  DULoxetine (CYMBALTA) 20 MG capsule Take 1 capsule (20 mg total) by mouth daily. 08/22/20   Charm Rings, NP  hydrOXYzine (ATARAX/VISTARIL) 25 MG tablet Take 25 mg by mouth daily. 03/26/21   [provider]  insulin aspart (NOVOLOG) 100 UNIT/ML injection Inject 12 Units into the skin 3 (three) times daily with meals. 07/09/20   Clapacs, Jackquline Denmark, MD  insulin detemir (LEVEMIR) 100 UNIT/ML injection Inject 0.23 mLs (23 Units total) into the skin at bedtime. 07/09/20   Clapacs, Jackquline Denmark, MD  lamoTRIgine (LAMICTAL) 100  MG tablet Take 100 mg by mouth 2 (two) times daily. 03/25/21   [provider]  lamoTRIgine (LAMICTAL) 200 MG tablet Take 1 tablet (200 mg total) by mouth daily. 07/09/20   Clapacs, Jackquline Denmark, MD  loratadine (CLARITIN) 10 MG tablet Take 1 tablet (10 mg total) by mouth daily. 07/10/20   Clapacs, Jackquline Denmark, MD  LORazepam (ATIVAN) 0.5 MG tablet Take 0.5 mg by mouth 2 (two) times daily as needed. 04/13/21   [provider]  LORazepam (ATIVAN) 1 MG tablet Take 1 tablet (1 mg total) by mouth daily as needed. 07/09/20   Clapacs, Jackquline Denmark, MD  metFORMIN (GLUCOPHAGE) 1000 MG tablet Take 1 tablet by mouth 2 (two) times daily. 04/01/21   [provider]  metFORMIN (GLUCOPHAGE) 500 MG tablet Take 1 tablet (500 mg total) by mouth 2 (two) times daily with a meal. 07/09/20   Clapacs, Jackquline Denmark, MD  metoCLOPramide (REGLAN) 10 MG tablet Take 10 mg by mouth at bedtime. 07/28/20   [provider]  MYRBETRIQ 50 MG TB24 tablet Take 50 mg by mouth daily. 07/28/20   [provider]  potassium chloride (KLOR-CON) 10 MEQ tablet Take 1 tablet (10 mEq total) by mouth daily. 08/17/20   Willy Eddy, MD  RISPERDAL 1 MG tablet Take 1 tablet (1 mg total) by mouth daily. 04/22/21 04/22/22  Leevy-Johnson, Lina Sar, NP  RISPERDAL 2 MG tablet Take 1 tablet (2 mg total) by mouth at bedtime for 14 days. 04/22/21 05/06/21  Leevy-Johnson, Lina Sar, NP  risperiDONE (RISPERDAL) 2 MG tablet Take 1 tablet (2 mg total) by mouth at bedtime. 08/21/20   Charm Rings, NP  sertraline (ZOLOFT) 100 MG tablet Take by mouth. 04/01/21   [provider]  TRADJENTA 5 MG TABS tablet Take 5 mg by mouth daily. 04/01/21   [provider]  traZODone (DESYREL) 100 MG tablet Take 1 tablet (100 mg total) by mouth at bedtime. 07/09/20   Clapacs, Jackquline Denmark, MD  traZODone (DESYREL) 150 MG tablet Take 150 mg by mouth at bedtime. 04/01/21   [provider]    Allergies    Penicillins  Review of Systems   Review of Systems  Constitutional:  Negative for chills and fever.  HENT:  Negative for ear pain and sore throat.   Eyes:  Negative for pain and visual disturbance.  Respiratory:  Positive for cough. Negative for shortness of breath.   Cardiovascular:  Negative for chest pain and palpitations.  Gastrointestinal:  Positive for abdominal pain ("like something is moving") and diarrhea. Negative for vomiting.  Genitourinary:  Positive for vaginal bleeding. Negative for dysuria and hematuria.  Musculoskeletal:  Negative for arthralgias and back pain.  Skin:  Negative for color change and rash.  Neurological:  Negative for seizures and syncope.  All other systems  reviewed and are negative.  Physical Exam Updated Vital Signs BP (!) 163/119 (BP Location: Left Arm)   Pulse (!) 119   Temp 98.3 F (36.8 C) (Oral)   Resp 16   Ht 5\' 7"  (1.702 m)   Wt 97.9 kg   LMP 05/23/2021   SpO2 98%   BMI 33.80 kg/m   Physical Exam Vitals and nursing note reviewed.  HENT:     Head: Normocephalic and atraumatic.  Eyes:     General: No scleral icterus. Pulmonary:     Effort: Pulmonary effort is normal. No respiratory distress.  Abdominal:     General: There is no distension.  Palpations: Abdomen is soft.     Tenderness: There is no abdominal tenderness.  Musculoskeletal:     Cervical back: Normal range of motion.  Skin:    General: Skin is warm and dry.  Neurological:     General: No focal deficit present.     Mental Status: She is alert and oriented to person, place, and time.  Psychiatric:        Mood and Affect: Mood normal.    ED Results / Procedures / Treatments   Labs (all labs ordered are listed, but only abnormal results are displayed) Labs Reviewed  CBC WITH DIFFERENTIAL/PLATELET - Abnormal; Notable for the following components:      Result Value   RBC 5.12 (*)    All other components within normal limits  COMPREHENSIVE METABOLIC PANEL - Abnormal; Notable for the following components:   Sodium 127 (*)    Chloride 93 (*)    CO2 20 (*)    Glucose, Bld 585 (*)    Total Protein 9.6 (*)    All other components within normal limits  URINALYSIS, ROUTINE W REFLEX MICROSCOPIC - Abnormal; Notable for the following components:   Color, Urine COLORLESS (*)    Glucose, UA >=500 (*)    Hgb urine dipstick SMALL (*)    All other components within normal limits  CBG MONITORING, ED - Abnormal; Notable for the following components:   Glucose-Capillary 321 (*)    All other components within normal limits  LIPASE, BLOOD  I-STAT BETA HCG BLOOD, ED ([facility], [facility], [facility] ONLY)    EKG None  Radiology No results found.  Procedures Procedures    Medications Ordered in ED Medications  ondansetron (ZOFRAN) injection 4 mg (has no administration in time range)  sodium chloride 0.9 % bolus 1,000 mL (has no administration in time range)    ED Course  I have reviewed the triage vital signs and the nursing notes.  Pertinent labs & imaging results that were available during my care of the patient were reviewed by me and considered in my medical decision making (see chart for details).    MDM Rules/Calculators/A&P                           Yumalay D Demma presents with ongoing diarrhea and cough after being diagnosed with COVID-19 over a week ago.  She was noted to be tachycardic and hyperglycemic.  She initially declined IV fluids, but did eventually consent to them.  She was given IV fluids as well as insulin.  Blood sugar came down, and she was counseled on symptomatic management as well as close follow-up with her primary care provider for ongoing monitoring of her blood glucose values. Final Clinical Impression(s) / ED Diagnoses Final diagnoses:  COVID-19  Hyperglycemia due to diabetes mellitus (HCC)    Rx / DC Orders ED Discharge Orders          Ordered    guaiFENesin (ROBITUSSIN) 100 MG/5ML SOLN  Every 4 hours PRN        06/14/21 2234    loperamide (IMODIUM) 2 MG capsule  4 times daily PRN        06/14/21 2234             Koleen Distance, MD 06/14/21 2237

## 2021-06-14 NOTE — ED Triage Notes (Signed)
Alexandria Reynolds the lady that runs the home she lives in.  Alexandria was swabbed for COVID on Monday at [facility] and found out of Wednesday that she was positive.  Alexandria since then diarrhea and abdominal pain.  Alexandria denies fever but has been having a productive cough.  Alexandria also states that she is have some vaginal bleeding and it is not time for her period.  Alexandria is concerned she could be pregnant and would like a blood test and not a urine test to final out.  Alexandria has a legal guardian.

## 2021-07-06 ENCOUNTER — Other Ambulatory Visit: Payer: Self-pay

## 2021-07-06 ENCOUNTER — Encounter: Payer: Self-pay | Admitting: Emergency Medicine

## 2021-07-06 ENCOUNTER — Emergency Department
Admission: EM | Admit: 2021-07-06 | Discharge: 2021-07-06 | Disposition: A | Discharge status: Home / Self Care | Payer: Medicare Other | Attending: Emergency Medicine | Admitting: Emergency Medicine

## 2021-07-06 DIAGNOSIS — N3090 Cystitis, unspecified without hematuria: Secondary | ICD-10-CM | POA: Diagnosis not present

## 2021-07-06 DIAGNOSIS — F17210 Nicotine dependence, cigarettes, uncomplicated: Secondary | ICD-10-CM | POA: Insufficient documentation

## 2021-07-06 DIAGNOSIS — Z794 Long term (current) use of insulin: Secondary | ICD-10-CM | POA: Insufficient documentation

## 2021-07-06 DIAGNOSIS — Z7984 Long term (current) use of oral hypoglycemic drugs: Secondary | ICD-10-CM | POA: Insufficient documentation

## 2021-07-06 DIAGNOSIS — E119 Type 2 diabetes mellitus without complications: Secondary | ICD-10-CM | POA: Insufficient documentation

## 2021-07-06 DIAGNOSIS — R109 Unspecified abdominal pain: Secondary | ICD-10-CM | POA: Diagnosis present

## 2021-07-06 DIAGNOSIS — J4520 Mild intermittent asthma, uncomplicated: Secondary | ICD-10-CM | POA: Diagnosis not present

## 2021-07-06 DIAGNOSIS — K219 Gastro-esophageal reflux disease without esophagitis: Secondary | ICD-10-CM | POA: Insufficient documentation

## 2021-07-06 LAB — CBC
HCT: 40.6 % (ref 36.0–46.0)
Hemoglobin: 13.6 g/dL (ref 12.0–15.0)
MCH: 27.6 pg (ref 26.0–34.0)
MCHC: 33.5 g/dL (ref 30.0–36.0)
MCV: 82.5 fL (ref 80.0–100.0)
Platelets: 265 10*3/uL (ref 150–400)
RBC: 4.92 MIL/uL (ref 3.87–5.11)
RDW: 12.6 % (ref 11.5–15.5)
WBC: 6.2 10*3/uL (ref 4.0–10.5)
nRBC: 0 % (ref 0.0–0.2)

## 2021-07-06 LAB — URINALYSIS, COMPLETE (UACMP) WITH MICROSCOPIC
Bilirubin Urine: NEGATIVE
Glucose, UA: 50 mg/dL — AB
Ketones, ur: 5 mg/dL — AB
Leukocytes,Ua: NEGATIVE
Nitrite: NEGATIVE
Protein, ur: 30 mg/dL — AB
RBC / HPF: >50 RBC/hpf — ABNORMAL HIGH (ref 0–5)
Specific Gravity, Urine: 1.029 (ref 1.005–1.030)
pH: 5 (ref 5.0–8.0)

## 2021-07-06 LAB — COMPREHENSIVE METABOLIC PANEL WITH GFR
ALT: 21 U/L (ref 0–44)
AST: 40 U/L (ref 15–41)
Albumin: 3.9 g/dL (ref 3.5–5.0)
Alkaline Phosphatase: 47 U/L (ref 38–126)
Anion gap: 14 (ref 5–15)
BUN: 14 mg/dL (ref 6–20)
CO2: 20 mmol/L — ABNORMAL LOW (ref 22–32)
Calcium: 9.2 mg/dL (ref 8.9–10.3)
Chloride: 103 mmol/L (ref 98–111)
Creatinine, Ser: 0.61 mg/dL (ref 0.44–1.00)
GFR, Estimated: >60 mL/min
Glucose, Bld: 156 mg/dL — ABNORMAL HIGH (ref 70–99)
Potassium: 3.5 mmol/L (ref 3.5–5.1)
Sodium: 137 mmol/L (ref 135–145)
Total Bilirubin: 0.5 mg/dL (ref 0.3–1.2)
Total Protein: 8.6 g/dL — ABNORMAL HIGH (ref 6.5–8.1)

## 2021-07-06 LAB — LIPASE, BLOOD: Lipase: 41 U/L (ref 11–51)

## 2021-07-06 LAB — POC URINE PREG, ED: Preg Test, Ur: NEGATIVE

## 2021-07-06 MED ORDER — ONDANSETRON 4 MG PO TBDP: 4.0000 mg | ORAL_TABLET | Freq: Three times a day (TID) | ORAL | 0 refills | Status: DC | PRN

## 2021-07-06 MED ORDER — CEPHALEXIN 500 MG PO CAPS: 500.0000 mg | ORAL_CAPSULE | Freq: Three times a day (TID) | ORAL | 0 refills | Status: DC

## 2021-07-06 MED ORDER — FAMOTIDINE 20 MG PO TABS: 20.0000 mg | ORAL_TABLET | Freq: Two times a day (BID) | ORAL | 0 refills | Status: AC

## 2021-07-06 NOTE — ED Triage Notes (Signed)
Pt comes into the ED via [facility] from home c/o abd pain with N/V/D.  Pt states that her symptoms have been ongoing for over a month.  PT states she was seen at [facility] last month and they were unable to figure out what was causing the pain.  Pt in NAD at this time with even and unlabored respirations.

## 2021-07-06 NOTE — ED Provider Notes (Signed)
[facility] Emergency Department Provider Note  ____________________________________________  Time seen: Approximately 1:22 PM  I have reviewed the triage vital signs and the nursing notes.   HISTORY  Chief Complaint Abdominal Pain    HPI Alexandria Reynolds is a 30 y.o. female with a history of diabetes, schizoaffective, intellectual disability, obesity who comes ED complaining of abdominal pain with nausea vomiting and diarrhea off and on for the past month.  She is previously been seen before for this.  Notes that it is worsened by eating.  Nonradiating, no alleviating factors.  No black or bloody stools, no bloody emesis.  No fevers chills or body aches.  Also notes some urinary frequency and dysuria.    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 No pertinent surgical history.   Prior to Admission medications   Medication Sig Start Date End Date Taking? Authorizing Provider  cephALEXin (KEFLEX) 500 MG capsule Take 1 capsule (500 mg total) by mouth 3 (three) times daily. 07/06/21  Yes Sharman Cheek, MD  famotidine (PEPCID) 20 MG tablet Take 1 tablet (20 mg  total) by mouth 2 (two) times daily. 07/06/21  Yes Sharman Cheek, MD  ondansetron (ZOFRAN ODT) 4 MG disintegrating tablet Take 1 tablet (4 mg total) by mouth every 8 (eight) hours as needed for nausea or vomiting. 07/06/21  Yes Sharman Cheek, MD  benztropine (COGENTIN) 0.5 MG tablet Take 1 tablet (0.5 mg total) by mouth 2 (two) times daily as needed for tremors. 08/21/20   Charm Rings, NP  DULoxetine (CYMBALTA) 20 MG capsule Take 1 capsule (20 mg total) by mouth daily. 08/22/20   Charm Rings, NP  guaiFENesin (ROBITUSSIN) 100 MG/5ML SOLN Take 5 mLs (100 mg total) by mouth every 4 (four) hours as needed for cough or to loosen phlegm. 06/14/21   Koleen Distance, MD  hydrOXYzine (ATARAX/VISTARIL) 25 MG tablet Take 25 mg by mouth daily. 03/26/21   [provider]  insulin aspart (NOVOLOG) 100 UNIT/ML injection Inject 12 Units into the skin 3 (three) times daily with meals. Patient not taking: Reported on 06/14/2021 07/09/20   Clapacs, Jackquline Denmark, MD  insulin detemir (LEVEMIR) 100 UNIT/ML injection Inject 0.23 mLs (23 Units total) into the skin at bedtime. Patient not taking: Reported on 06/14/2021 07/09/20   Clapacs, Jackquline Denmark, MD  lamoTRIgine (LAMICTAL) 100 MG tablet Take 100 mg by mouth 2 (two) times daily. 03/25/21   [provider]  lamoTRIgine (LAMICTAL) 200 MG tablet Take 1 tablet (200 mg total) by mouth daily. 07/09/20   Clapacs, Jackquline Denmark, MD  loperamide (IMODIUM) 2 MG capsule Take 1 capsule (2 mg total) by mouth 4 (four) times  daily as needed for diarrhea or loose stools. 06/14/21   Koleen Distance, MD  loratadine (CLARITIN) 10 MG tablet Take 1 tablet (10 mg total) by mouth daily. 07/10/20   Clapacs, Jackquline Denmark, MD  LORazepam (ATIVAN) 0.5 MG tablet Take 0.5 mg by mouth 2 (two) times daily as needed. 04/13/21   [provider]  LORazepam (ATIVAN) 1 MG tablet Take 1 tablet (1 mg total) by mouth daily as needed. 07/09/20   Clapacs, Jackquline Denmark, MD  metFORMIN (GLUCOPHAGE) 1000 MG tablet Take 1  tablet by mouth 2 (two) times daily. 04/01/21   [provider]  metFORMIN (GLUCOPHAGE) 500 MG tablet Take 1 tablet (500 mg total) by mouth 2 (two) times daily with a meal. 07/09/20   Clapacs, Jackquline Denmark, MD  metoCLOPramide (REGLAN) 10 MG tablet Take 10 mg by mouth at bedtime. 07/28/20   [provider]  MYRBETRIQ 50 MG TB24 tablet Take 50 mg by mouth daily. 07/28/20   [provider]  potassium chloride (KLOR-CON) 10 MEQ tablet Take 1 tablet (10 mEq total) by mouth daily. 08/17/20   Willy Eddy, MD  RISPERDAL 1 MG tablet Take 1 tablet (1 mg total) by mouth daily. 04/22/21 04/22/22  Leevy-Johnson, Lina Sar, NP  RISPERDAL 2 MG tablet Take 1 tablet (2 mg total) by mouth at bedtime for 14 days. 04/22/21 05/06/21  Leevy-Johnson, Lina Sar, NP  risperiDONE (RISPERDAL) 2 MG tablet Take 1 tablet (2 mg total) by mouth at bedtime. 08/21/20   Charm Rings, NP  sertraline (ZOLOFT) 100 MG tablet Take by mouth. 04/01/21   [provider]  TRADJENTA 5 MG TABS tablet Take 5 mg by mouth daily. 04/01/21   [provider]  traZODone (DESYREL) 100 MG tablet Take 1 tablet (100 mg total) by mouth at bedtime. 07/09/20   Clapacs, Jackquline Denmark, MD  traZODone (DESYREL) 150 MG tablet Take 150 mg by mouth at bedtime. 04/01/21   [provider]     Allergies Penicillins   Family History  Problem Relation Age of Onset   Drug abuse Mother    Alcohol abuse Mother    Alcohol abuse Brother    Drug abuse Brother    Alcohol abuse Brother     Social History Social History   Tobacco Use   Smoking status: Every Day    Packs/day: 0.05    Types: Cigarettes    Start date: 02/14/2014   Smokeless tobacco: Never  Vaping Use   Vaping Use: Never used  Substance Use Topics   Alcohol use: No    Alcohol/week: 0.0 standard drinks   Drug use: No    Review of Systems  Constitutional:   No fever or chills.  ENT:   No sore throat. No rhinorrhea. Cardiovascular:   No chest pain or  syncope. Respiratory:   No dyspnea or cough. Gastrointestinal:   Positive as above for abdominal pain, vomiting and diarrhea.  Musculoskeletal:   Negative for focal pain or swelling All other systems reviewed and are negative except as documented above in ROS and HPI.  ____________________________________________   PHYSICAL EXAM:  VITAL SIGNS: ED Triage Vitals  Enc Vitals Group     BP 07/06/21 1012 122/89     Pulse Rate 07/06/21 1012 98     Resp 07/06/21 1012 18     Temp 07/06/21 1012 98.2 F (36.8 C)     Temp Source 07/06/21 1012 Oral     SpO2 07/06/21 1012 98 %  Weight 07/06/21 1020 215 lb 6.2 oz (97.7 kg)     Height 07/06/21 1020 5\' 7"  (1.702 m)     Head Circumference --      Peak Flow --      Pain Score 07/06/21 1020 10     Pain Loc --      Pain Edu? --      Excl. in GC? --     Vital signs reviewed, nursing assessments reviewed.   Constitutional:   Alert and oriented. Non-toxic appearance. Eyes:   Conjunctivae are normal. EOMI. PERRL. ENT      Head:   Normocephalic and atraumatic.      Nose:   Wearing a mask.      Mouth/Throat:   Wearing a mask.      Neck:   No meningismus. Full ROM. Hematological/Lymphatic/Immunilogical:   No cervical lymphadenopathy. Cardiovascular:   RRR. Symmetric bilateral radial and DP pulses.  No murmurs. Cap refill less than 2 seconds. Respiratory:   Normal respiratory effort without tachypnea/retractions. Breath sounds are clear and equal bilaterally. No wheezes/rales/rhonchi. Gastrointestinal:   Soft with mild left upper quadrant tenderness and suprapubic tenderness. Non distended. There is no CVA tenderness.  No rebound, rigidity, or guarding. Genitourinary:   deferred Musculoskeletal:   Normal range of motion in all extremities. No joint effusions.  No lower extremity tenderness.  No edema. Neurologic:   Normal speech and language.  Motor grossly intact. No acute focal neurologic deficits are appreciated.  Skin:    Skin is warm,  dry and intact. No rash noted.  No petechiae, purpura, or bullae.  ____________________________________________    LABS (pertinent positives/negatives) (all labs ordered are listed, but only abnormal results are displayed) Labs Reviewed  COMPREHENSIVE METABOLIC PANEL - Abnormal; Notable for the following components:      Result Value   CO2 20 (*)    Glucose, Bld 156 (*)    Total Protein 8.6 (*)    All other components within normal limits  URINALYSIS, COMPLETE (UACMP) WITH MICROSCOPIC - Abnormal; Notable for the following components:   Color, Urine YELLOW (*)    APPearance CLOUDY (*)    Glucose, UA 50 (*)    Hgb urine dipstick LARGE (*)    Ketones, ur 5 (*)    Protein, ur 30 (*)    RBC / HPF >50 (*)    Bacteria, UA RARE (*)    All other components within normal limits  LIPASE, BLOOD  CBC  POC URINE PREG, ED   ____________________________________________   EKG    ____________________________________________    RADIOLOGY  No results found.  ____________________________________________   PROCEDURES Procedures  ____________________________________________    CLINICAL IMPRESSION / ASSESSMENT AND PLAN / ED COURSE  Medications ordered in the ED: Medications - No data to display  Pertinent labs & imaging results that were available during my care of the patient were reviewed by me and considered in my medical decision making (see chart for details).  Aylen D Severino was evaluated in Emergency Department on 07/06/2021 for the symptoms described in the history of present illness. She was evaluated in the context of the global COVID-19 pandemic, which necessitated consideration that the patient might be at risk for infection with the SARS-CoV-2 virus that causes COVID-19. Institutional protocols and algorithms that pertain to the evaluation of patients at risk for COVID-19 are in a state of rapid change based on information released by regulatory bodies including the  CDC and federal and state organizations. These  policies and algorithms were followed during the patient's care in the ED.   Patient presents with symptoms most compatible with GERD as well as UTI as seen on labs.  Serum labs unremarkable.  Vital signs normal, overall she is nontoxic and exam is reassuring. Considering the patient's symptoms, medical history, and physical examination today, I have low suspicion for cholecystitis or biliary pathology, pancreatitis, perforation or bowel obstruction, hernia, intra-abdominal abscess, AAA or dissection, volvulus or intussusception, mesenteric ischemia, or appendicitis.  Doubt STI PID TOA or torsion.       ____________________________________________   FINAL CLINICAL IMPRESSION(S) / ED DIAGNOSES    Final diagnoses:  Cystitis  Gastroesophageal reflux disease without esophagitis     ED Discharge Orders          Ordered    cephALEXin (KEFLEX) 500 MG capsule  3 times daily        07/06/21 1321    ondansetron (ZOFRAN ODT) 4 MG disintegrating tablet  Every 8 hours PRN        07/06/21 1321    famotidine (PEPCID) 20 MG tablet  2 times daily        07/06/21 1321            Portions of this note were generated with dragon dictation software. Dictation errors may occur despite best attempts at proofreading.    Sharman CheekStafford, Rylyn Zawistowski, MD 07/06/21 1324

## 2021-07-06 NOTE — ED Notes (Addendum)
See triage note  Presents with abd pain  States pain started about 1 week ago   Denies nay fever  But has had some n/v  States had similar pain about 1 month ago

## 2021-07-06 NOTE — ED Notes (Addendum)
First nurse: abd pain x1 week, n/v. Might be pregnant but also states she's on her period. Pt from a group home. CBG 203.

## 2021-08-17 ENCOUNTER — Other Ambulatory Visit (HOSPITAL_COMMUNITY): Payer: Self-pay | Admitting: Psychiatry

## 2022-05-03 ENCOUNTER — Inpatient Hospital Stay
Admission: EM | Admit: 2022-05-03 | Discharge: 2022-05-06 | DRG: 872 | Disposition: A | Discharge status: Home / Self Care | Payer: 59 | Attending: Internal Medicine | Admitting: Internal Medicine

## 2022-05-03 ENCOUNTER — Emergency Department: Payer: 59

## 2022-05-03 DIAGNOSIS — A419 Sepsis, unspecified organism: Secondary | ICD-10-CM | POA: Diagnosis not present

## 2022-05-03 DIAGNOSIS — Z79899 Other long term (current) drug therapy: Secondary | ICD-10-CM

## 2022-05-03 DIAGNOSIS — J4520 Mild intermittent asthma, uncomplicated: Secondary | ICD-10-CM | POA: Diagnosis present

## 2022-05-03 DIAGNOSIS — F79 Unspecified intellectual disabilities: Secondary | ICD-10-CM | POA: Diagnosis present

## 2022-05-03 DIAGNOSIS — R9431 Abnormal electrocardiogram [ECG] [EKG]: Secondary | ICD-10-CM | POA: Diagnosis present

## 2022-05-03 DIAGNOSIS — E119 Type 2 diabetes mellitus without complications: Secondary | ICD-10-CM | POA: Insufficient documentation

## 2022-05-03 DIAGNOSIS — Z880 Allergy status to penicillin: Secondary | ICD-10-CM

## 2022-05-03 DIAGNOSIS — R509 Fever, unspecified: Principal | ICD-10-CM

## 2022-05-03 DIAGNOSIS — F17210 Nicotine dependence, cigarettes, uncomplicated: Secondary | ICD-10-CM | POA: Diagnosis present

## 2022-05-03 DIAGNOSIS — Z794 Long term (current) use of insulin: Secondary | ICD-10-CM

## 2022-05-03 DIAGNOSIS — F259 Schizoaffective disorder, unspecified: Secondary | ICD-10-CM | POA: Clinically undetermined

## 2022-05-03 DIAGNOSIS — E876 Hypokalemia: Secondary | ICD-10-CM | POA: Diagnosis not present

## 2022-05-03 DIAGNOSIS — F319 Bipolar disorder, unspecified: Secondary | ICD-10-CM | POA: Diagnosis present

## 2022-05-03 DIAGNOSIS — Z6833 Body mass index (BMI) 33.0-33.9, adult: Secondary | ICD-10-CM

## 2022-05-03 DIAGNOSIS — E669 Obesity, unspecified: Secondary | ICD-10-CM | POA: Diagnosis present

## 2022-05-03 DIAGNOSIS — F89 Unspecified disorder of psychological development: Secondary | ICD-10-CM | POA: Diagnosis present

## 2022-05-03 DIAGNOSIS — Z7984 Long term (current) use of oral hypoglycemic drugs: Secondary | ICD-10-CM

## 2022-05-03 DIAGNOSIS — E1165 Type 2 diabetes mellitus with hyperglycemia: Secondary | ICD-10-CM | POA: Diagnosis present

## 2022-05-03 DIAGNOSIS — N10 Acute tubulo-interstitial nephritis: Secondary | ICD-10-CM | POA: Diagnosis present

## 2022-05-03 DIAGNOSIS — F909 Attention-deficit hyperactivity disorder, unspecified type: Secondary | ICD-10-CM | POA: Diagnosis present

## 2022-05-03 DIAGNOSIS — Z20822 Contact with and (suspected) exposure to covid-19: Secondary | ICD-10-CM | POA: Diagnosis present

## 2022-05-03 LAB — COMPREHENSIVE METABOLIC PANEL
ALT: 23 U/L (ref 0–44)
AST: 31 U/L (ref 15–41)
Albumin: 3.8 g/dL (ref 3.5–5.0)
Alkaline Phosphatase: 51 U/L (ref 38–126)
Anion gap: 13 (ref 5–15)
BUN: 15 mg/dL (ref 6–20)
CO2: 20 mmol/L — ABNORMAL LOW (ref 22–32)
Calcium: 8.9 mg/dL (ref 8.9–10.3)
Chloride: 98 mmol/L (ref 98–111)
Creatinine, Ser: 0.79 mg/dL (ref 0.44–1.00)
GFR, Estimated: >60 mL/min (ref >60)
Glucose, Bld: 360 mg/dL — ABNORMAL HIGH (ref 70–99)
Potassium: 3.5 mmol/L (ref 3.5–5.1)
Sodium: 131 mmol/L — ABNORMAL LOW (ref 135–145)
Total Bilirubin: 0.5 mg/dL (ref 0.3–1.2)
Total Protein: 9.4 g/dL — ABNORMAL HIGH (ref 6.5–8.1)

## 2022-05-03 LAB — CBC
HCT: 35.5 % — ABNORMAL LOW (ref 36.0–46.0)
Hemoglobin: 11.6 g/dL — ABNORMAL LOW (ref 12.0–15.0)
MCH: 27 pg (ref 26.0–34.0)
MCHC: 32.7 g/dL (ref 30.0–36.0)
MCV: 82.6 fL (ref 80.0–100.0)
Platelets: 241 10*3/uL (ref 150–400)
RBC: 4.3 MIL/uL (ref 3.87–5.11)
RDW: 13.6 % (ref 11.5–15.5)
WBC: 16.1 10*3/uL — ABNORMAL HIGH (ref 4.0–10.5)
nRBC: 0 % (ref 0.0–0.2)

## 2022-05-03 LAB — CBG MONITORING, ED: Glucose-Capillary: 375 mg/dL — ABNORMAL HIGH (ref 70–99)

## 2022-05-03 LAB — LIPASE, BLOOD: Lipase: 26 U/L (ref 11–51)

## 2022-05-03 LAB — LACTIC ACID, PLASMA: Lactic Acid, Venous: 1.5 mmol/L (ref 0.5–1.9)

## 2022-05-03 IMAGING — DX DG CHEST 1V PORT
1 series · 1 of 1 positions shown · non-contrast
Comparison: Chest radiograph dated [DATE].

CLINICAL DATA: Fever.

EXAM:
PORTABLE CHEST 1 VIEW

[chest ap]
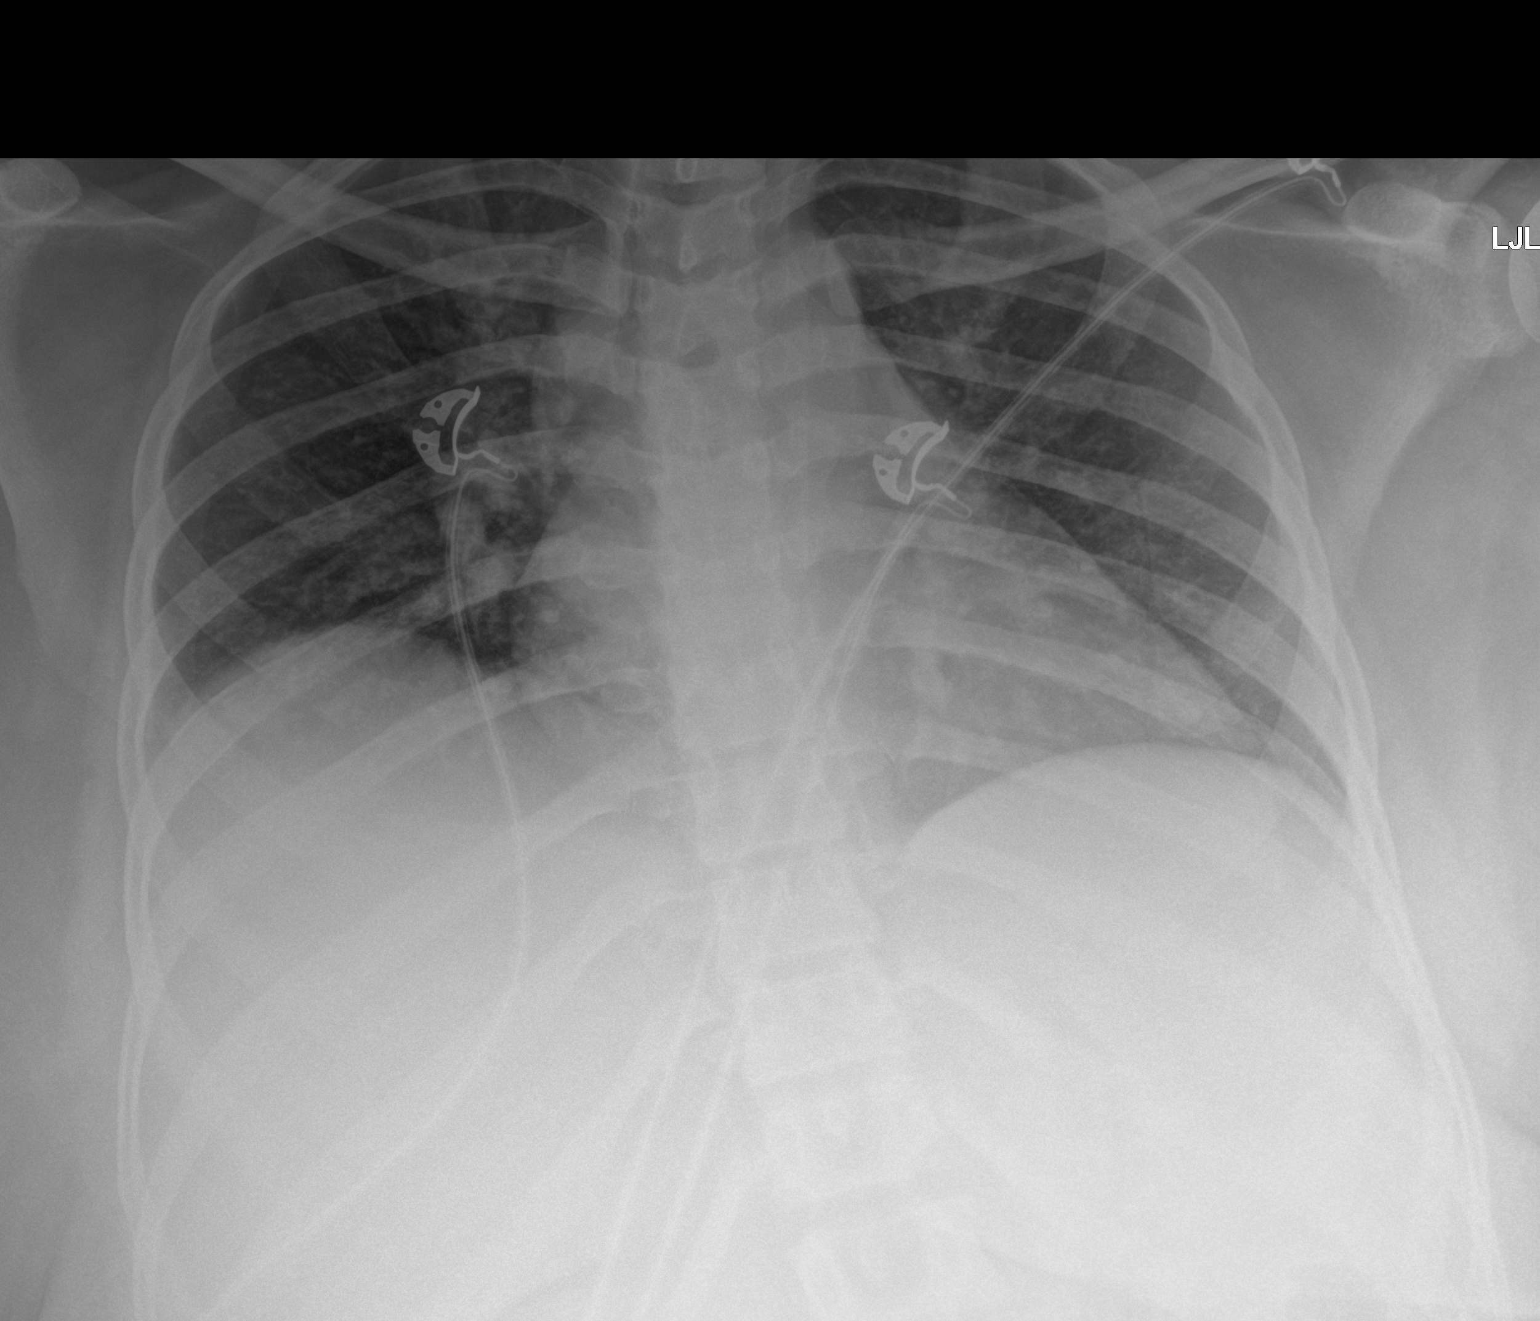

[1 of 1 positions shown; findings below may reference images not displayed]

FINDINGS: Shallow inspiration. No focal consolidation, pleural effusion, or
pneumothorax. Top-normal cardiac silhouette. No acute osseous
pathology.
IMPRESSION: No active cardiopulmonary disease.

## 2022-05-03 MED ORDER — LACTATED RINGERS IV BOLUS
1000.0000 mL | Freq: Once | INTRAVENOUS | Status: AC
  Administered 2022-05-03: 1000 mL via INTRAVENOUS

## 2022-05-03 MED ORDER — ACETAMINOPHEN 500 MG PO TABS
1000.0000 mg | ORAL_TABLET | Freq: Once | ORAL | Status: AC
  Administered 2022-05-03: 1000 mg via ORAL
  Filled 2022-05-03: qty 2

## 2022-05-03 MED ORDER — SODIUM CHLORIDE 0.9 % IV SOLN
2.0000 g | Freq: Three times a day (TID) | INTRAVENOUS | Status: DC
Start: 2022-05-03 — End: 2022-05-04
  Administered 2022-05-03 – 2022-05-04 (×2): 2 g via INTRAVENOUS
  Filled 2022-05-03 (×2): qty 10

## 2022-05-03 MED ORDER — VANCOMYCIN HCL IN DEXTROSE 1-5 GM/200ML-% IV SOLN
1000.0000 mg | Freq: Once | INTRAVENOUS | Status: AC
  Administered 2022-05-04: 1000 mg via INTRAVENOUS
  Filled 2022-05-03: qty 200

## 2022-05-03 MED ORDER — METRONIDAZOLE 500 MG/100ML IV SOLN
500.0000 mg | Freq: Once | INTRAVENOUS | Status: AC
  Administered 2022-05-04: 500 mg via INTRAVENOUS
  Filled 2022-05-03: qty 100

## 2022-05-03 NOTE — ED Triage Notes (Signed)
Pt presents via EMS from a group home [facility]) with complaints of N/V for the last several days. With EMS pt received 4mg  of zofran and ~31mL of NS. Hx of DM  unsure if its well controlled- CBG of 388 with Ems.   CBG 375 in triage.

## 2022-05-03 NOTE — ED Notes (Signed)
This RN contacted Delton See with [facility] [phone] to notify him of the patient being brought to the ED - he stated that she is no longer on her case list and to contact the group home for her new legal guardians information.

## 2022-05-03 NOTE — ED Provider Notes (Signed)
[facility] Provider Note    Event Date/Time   First MD Initiated Contact with Patient 05/03/22 2207     (approximate)   History   Emesis   HPI  Alexandria Reynolds is a 31 y.o. female  who has history of schizophrenia, intellectual disorder, who presents to the emergency department today from group home because  of concern for nausea and vomiting for a few days. Unfortunately patient is unable to give any significant history. Only nods yes or no to certain questions. Denies any pain.      Physical Exam   Triage Vital Signs: ED Triage Vitals  Enc Vitals Group     BP 05/03/22 2136 (!) 146/74     Pulse Rate 05/03/22 2136 (!) 116     Resp 05/03/22 2136 18     Temp 05/03/22 2136 (!) 102.6 F (39.2 C)     Temp Source 05/03/22 2136 Oral     SpO2 05/03/22 2136 98 %     Weight 05/03/22 2135 216 lb 0.8 oz (98 kg)     Height 05/03/22 2135 5\' 7"  (1.702 m)     Head Circumference --      Peak Flow --      Pain Score 05/03/22 2135 10     Pain Loc --      Pain Edu? --      Excl. in GC? --     Most recent vital signs: Vitals:   05/03/22 2230 05/03/22 2259  BP: 120/74   Pulse: (!) 114   Resp: (!) 27   Temp:  (!) 101.1 F (38.4 C)  SpO2: 99%    General: Awake, alert. CV:  Good peripheral perfusion. Tachycardia. Resp:  Normal effort. Lungs clear. Abd:  No distention. Tender to palpation on the right side.   ED Results / Procedures / Treatments   Labs (all labs ordered are listed, but only abnormal results are displayed) Labs Reviewed  COMPREHENSIVE METABOLIC PANEL - Abnormal; Notable for the following components:      Result Value   Sodium 131 (*)    CO2 20 (*)    Glucose, Bld 360 (*)    Total Protein 9.4 (*)    All other components within normal limits  CBC - Abnormal; Notable for the following components:   WBC 16.1 (*)    Hemoglobin 11.6 (*)    HCT 35.5 (*)    All other components within normal limits  CBG MONITORING, ED - Abnormal;  Notable for the following components:   Glucose-Capillary 375 (*)    All other components within normal limits  CULTURE, BLOOD (ROUTINE X 2)  CULTURE, BLOOD (ROUTINE X 2)  SARS CORONAVIRUS 2 BY RT PCR  LIPASE, BLOOD  URINALYSIS, ROUTINE W REFLEX MICROSCOPIC  LACTIC ACID, PLASMA  LACTIC ACID, PLASMA  POC URINE PREG, ED     EKG  I, 07/03/22, attending physician, personally viewed and interpreted this EKG  EKG Time: 2140 Rate: 124 Rhythm: sinus tachycardia Axis: normal Intervals: qtc 597 QRS: narrow, q waves v1 ST changes: no st elevation Impression: abnormal ekg  RADIOLOGY CT pending at time of sign out   PROCEDURES:  Critical Care performed: No  Procedures   MEDICATIONS ORDERED IN ED: Medications  aztreonam (AZACTAM) 2 g in sodium chloride 0.9 % 100 mL IVPB (has no administration in time range)  vancomycin (VANCOCIN) IVPB 1000 mg/200 mL premix (has no administration in time range)  metroNIDAZOLE (FLAGYL) IVPB 500 mg (  has no administration in time range)  lactated ringers bolus 1,000 mL (has no administration in time range)  acetaminophen (TYLENOL) tablet 1,000 mg (1,000 mg Oral Given 05/03/22 2130)  lactated ringers bolus 1,000 mL (1,000 mLs Intravenous New Bag/Given 05/03/22 2258)     IMPRESSION / MDM / ASSESSMENT AND PLAN / ED COURSE  I reviewed the triage vital signs and the nursing notes.                              Differential diagnosis includes, but is not limited to, infection, hyperthyroid, hepatitis.  Patient's presentation is most consistent with acute presentation with potential threat to life or bodily function.  Patient presents from group home because of concern for n/v. Unfortunately patient unable to give any significant history. Found to be febrile tachycardic and tachypnea. Blood work with leukocytosis. Do have concern for infection. Did have tenderness to the right side of the abdomen. IV fluids and antibiotics were ordered. CT pending  at time of sign out.  FINAL CLINICAL IMPRESSION(S) / ED DIAGNOSES   Final diagnoses:  Fever, unspecified fever cause     Note:  This document was prepared using Dragon voice recognition software and may include unintentional dictation errors.    Phineas Semen, MD 05/03/22 [phone]

## 2022-05-03 NOTE — ED Notes (Signed)
Alexandria Reynolds WITH THE [facility] [place] [facility] AFTER HOURS PROVIDES CONSENT FOR TREATMENT AND PLAN OF CARE AS WELL AS CONSENT TO DISCHARGE BACK TO GROUP HOME ONCE MEDICALLY CLEARED.   NEW FYI IN PLACE FOR NEW GUARDIAN[employer] INFORMATION

## 2022-05-04 ENCOUNTER — Emergency Department: Payer: 59

## 2022-05-04 ENCOUNTER — Encounter: Payer: Self-pay | Admitting: Internal Medicine

## 2022-05-04 DIAGNOSIS — Z7984 Long term (current) use of oral hypoglycemic drugs: Secondary | ICD-10-CM | POA: Diagnosis not present

## 2022-05-04 DIAGNOSIS — F259 Schizoaffective disorder, unspecified: Secondary | ICD-10-CM | POA: Diagnosis not present

## 2022-05-04 DIAGNOSIS — F319 Bipolar disorder, unspecified: Secondary | ICD-10-CM | POA: Diagnosis present

## 2022-05-04 DIAGNOSIS — N390 Urinary tract infection, site not specified: Secondary | ICD-10-CM

## 2022-05-04 DIAGNOSIS — F909 Attention-deficit hyperactivity disorder, unspecified type: Secondary | ICD-10-CM | POA: Diagnosis present

## 2022-05-04 DIAGNOSIS — N10 Acute tubulo-interstitial nephritis: Secondary | ICD-10-CM

## 2022-05-04 DIAGNOSIS — F79 Unspecified intellectual disabilities: Secondary | ICD-10-CM | POA: Diagnosis present

## 2022-05-04 DIAGNOSIS — Z6833 Body mass index (BMI) 33.0-33.9, adult: Secondary | ICD-10-CM | POA: Diagnosis not present

## 2022-05-04 DIAGNOSIS — A419 Sepsis, unspecified organism: Secondary | ICD-10-CM | POA: Diagnosis present

## 2022-05-04 DIAGNOSIS — F17210 Nicotine dependence, cigarettes, uncomplicated: Secondary | ICD-10-CM | POA: Diagnosis present

## 2022-05-04 DIAGNOSIS — Z880 Allergy status to penicillin: Secondary | ICD-10-CM | POA: Diagnosis not present

## 2022-05-04 DIAGNOSIS — E1165 Type 2 diabetes mellitus with hyperglycemia: Secondary | ICD-10-CM | POA: Diagnosis present

## 2022-05-04 DIAGNOSIS — Z79899 Other long term (current) drug therapy: Secondary | ICD-10-CM | POA: Diagnosis not present

## 2022-05-04 DIAGNOSIS — J4520 Mild intermittent asthma, uncomplicated: Secondary | ICD-10-CM | POA: Diagnosis present

## 2022-05-04 DIAGNOSIS — E876 Hypokalemia: Secondary | ICD-10-CM | POA: Diagnosis not present

## 2022-05-04 DIAGNOSIS — Z20822 Contact with and (suspected) exposure to covid-19: Secondary | ICD-10-CM | POA: Diagnosis present

## 2022-05-04 DIAGNOSIS — I4581 Long QT syndrome: Secondary | ICD-10-CM | POA: Diagnosis not present

## 2022-05-04 DIAGNOSIS — E669 Obesity, unspecified: Secondary | ICD-10-CM | POA: Diagnosis present

## 2022-05-04 DIAGNOSIS — R9431 Abnormal electrocardiogram [ECG] [EKG]: Secondary | ICD-10-CM | POA: Diagnosis present

## 2022-05-04 DIAGNOSIS — Z794 Long term (current) use of insulin: Secondary | ICD-10-CM | POA: Diagnosis not present

## 2022-05-04 LAB — CBG MONITORING, ED
Glucose-Capillary: 248 mg/dL — ABNORMAL HIGH (ref 70–99)
Glucose-Capillary: 271 mg/dL — ABNORMAL HIGH (ref 70–99)

## 2022-05-04 LAB — CBC WITH DIFFERENTIAL/PLATELET
Abs Immature Granulocytes: 0.05 10*3/uL (ref 0.00–0.07)
Basophils Absolute: 0 10*3/uL (ref 0.0–0.1)
Basophils Relative: 0 %
Eosinophils Absolute: 0 10*3/uL (ref 0.0–0.5)
Eosinophils Relative: 0 %
HCT: 33.8 % — ABNORMAL LOW (ref 36.0–46.0)
Hemoglobin: 10.9 g/dL — ABNORMAL LOW (ref 12.0–15.0)
Immature Granulocytes: 0 %
Lymphocytes Relative: 17 %
Lymphs Abs: 1.9 10*3/uL (ref 0.7–4.0)
MCH: 26.8 pg (ref 26.0–34.0)
MCHC: 32.2 g/dL (ref 30.0–36.0)
MCV: 83 fL (ref 80.0–100.0)
Monocytes Absolute: 0.9 10*3/uL (ref 0.1–1.0)
Monocytes Relative: 8 %
Neutro Abs: 8.5 10*3/uL — ABNORMAL HIGH (ref 1.7–7.7)
Neutrophils Relative %: 75 %
Platelets: 238 10*3/uL (ref 150–400)
RBC: 4.07 MIL/uL (ref 3.87–5.11)
RDW: 14.1 % (ref 11.5–15.5)
WBC: 11.4 10*3/uL — ABNORMAL HIGH (ref 4.0–10.5)
nRBC: 0 % (ref 0.0–0.2)

## 2022-05-04 LAB — GLUCOSE, CAPILLARY
Glucose-Capillary: 128 mg/dL — ABNORMAL HIGH (ref 70–99)
Glucose-Capillary: 158 mg/dL — ABNORMAL HIGH (ref 70–99)
Glucose-Capillary: 178 mg/dL — ABNORMAL HIGH (ref 70–99)

## 2022-05-04 LAB — SARS CORONAVIRUS 2 BY RT PCR: SARS Coronavirus 2 by RT PCR: NEGATIVE

## 2022-05-04 LAB — URINALYSIS, ROUTINE W REFLEX MICROSCOPIC
Bilirubin Urine: NEGATIVE
Glucose, UA: >=500 mg/dL — AB
Ketones, ur: 5 mg/dL — AB
Nitrite: NEGATIVE
Protein, ur: 100 mg/dL — AB
Specific Gravity, Urine: 1.031 — ABNORMAL HIGH (ref 1.005–1.030)
WBC, UA: >50 WBC/hpf — ABNORMAL HIGH (ref 0–5)
pH: 5 (ref 5.0–8.0)

## 2022-05-04 LAB — CREATININE, SERUM
Creatinine, Ser: 0.77 mg/dL (ref 0.44–1.00)
GFR, Estimated: >60 mL/min (ref >60)

## 2022-05-04 LAB — PROTIME-INR
INR: 1.3 — ABNORMAL HIGH (ref 0.8–1.2)
Prothrombin Time: 15.9 seconds — ABNORMAL HIGH (ref 11.4–15.2)

## 2022-05-04 LAB — PROCALCITONIN: Procalcitonin: 1.84 ng/mL

## 2022-05-04 LAB — HCG, QUANTITATIVE, PREGNANCY: hCG, Beta Chain, Quant, S: <1 m[IU]/mL (ref <5)

## 2022-05-04 LAB — CORTISOL-AM, BLOOD: Cortisol - AM: 16.2 ug/dL (ref 6.7–22.6)

## 2022-05-04 LAB — HEMOGLOBIN A1C
Hgb A1c MFr Bld: 7 % — ABNORMAL HIGH (ref 4.8–5.6)
Mean Plasma Glucose: 154.2 mg/dL

## 2022-05-04 LAB — HIV ANTIBODY (ROUTINE TESTING W REFLEX): HIV Screen 4th Generation wRfx: NONREACTIVE

## 2022-05-04 MED ORDER — IBUPROFEN 400 MG PO TABS: 400.0000 mg | ORAL_TABLET | Freq: Four times a day (QID) | ORAL | Status: DC | PRN

## 2022-05-04 MED ORDER — INSULIN ASPART 100 UNIT/ML IJ SOLN
0.0000 [IU] | Freq: Every day | INTRAMUSCULAR | Status: DC
  Administered 2022-05-05: 2 [IU] via SUBCUTANEOUS
  Filled 2022-05-04: qty 1

## 2022-05-04 MED ORDER — ACETAMINOPHEN 650 MG RE SUPP: 650.0000 mg | Freq: Four times a day (QID) | RECTAL | Status: DC | PRN

## 2022-05-04 MED ORDER — ENOXAPARIN SODIUM 60 MG/0.6ML IJ SOSY
0.5000 mg/kg | PREFILLED_SYRINGE | INTRAMUSCULAR | Status: DC
  Administered 2022-05-04 – 2022-05-06 (×3): 50 mg via SUBCUTANEOUS
  Filled 2022-05-04 (×3): qty 0.6

## 2022-05-04 MED ORDER — CLOZAPINE 25 MG PO TABS
250.0000 mg | ORAL_TABLET | Freq: Every day | ORAL | Status: DC
  Administered 2022-05-04 – 2022-05-05 (×2): 250 mg via ORAL
  Filled 2022-05-04 (×2): qty 2

## 2022-05-04 MED ORDER — LACTATED RINGERS IV BOLUS
1000.0000 mL | Freq: Once | INTRAVENOUS | Status: AC
  Administered 2022-05-04: 1000 mL via INTRAVENOUS

## 2022-05-04 MED ORDER — ONDANSETRON HCL 4 MG/2ML IJ SOLN
4.0000 mg | Freq: Four times a day (QID) | INTRAMUSCULAR | Status: DC | PRN
  Administered 2022-05-05 – 2022-05-06 (×2): 4 mg via INTRAVENOUS
  Filled 2022-05-04 (×2): qty 2

## 2022-05-04 MED ORDER — INSULIN DETEMIR 100 UNIT/ML ~~LOC~~ SOLN
10.0000 [IU] | Freq: Every day | SUBCUTANEOUS | Status: DC
  Administered 2022-05-04 – 2022-05-06 (×3): 10 [IU] via SUBCUTANEOUS
  Filled 2022-05-04 (×3): qty 0.1

## 2022-05-04 MED ORDER — RISPERIDONE 1 MG PO TABS
2.0000 mg | ORAL_TABLET | Freq: Every day | ORAL | Status: DC
  Administered 2022-05-05: 2 mg via ORAL
  Filled 2022-05-04: qty 2

## 2022-05-04 MED ORDER — ACETAMINOPHEN 325 MG PO TABS
650.0000 mg | ORAL_TABLET | Freq: Four times a day (QID) | ORAL | Status: DC | PRN
  Administered 2022-05-04 (×3): 650 mg via ORAL
  Filled 2022-05-04 (×3): qty 2

## 2022-05-04 MED ORDER — RISPERIDONE 1 MG PO TABS
1.0000 mg | ORAL_TABLET | Freq: Every day | ORAL | Status: DC
  Administered 2022-05-04: 1 mg via ORAL
  Filled 2022-05-04: qty 1

## 2022-05-04 MED ORDER — LAMOTRIGINE 25 MG PO TABS
200.0000 mg | ORAL_TABLET | Freq: Every day | ORAL | Status: DC
  Administered 2022-05-04 – 2022-05-05 (×2): 200 mg via ORAL
  Filled 2022-05-04 (×2): qty 8

## 2022-05-04 MED ORDER — IOHEXOL 300 MG/ML  SOLN
100.0000 mL | Freq: Once | INTRAMUSCULAR | Status: AC | PRN
  Administered 2022-05-04: 100 mL via INTRAVENOUS

## 2022-05-04 MED ORDER — CEFTRIAXONE SODIUM 2 G IJ SOLR
2.0000 g | INTRAMUSCULAR | Status: DC
Start: 2022-05-04 — End: 2022-05-05
  Administered 2022-05-04: 2 g via INTRAVENOUS
  Filled 2022-05-04 (×2): qty 20

## 2022-05-04 MED ORDER — HYDROCODONE-ACETAMINOPHEN 5-325 MG PO TABS
1.0000 | ORAL_TABLET | ORAL | Status: DC | PRN
  Administered 2022-05-04: 1 via ORAL
  Filled 2022-05-04: qty 1

## 2022-05-04 MED ORDER — ONDANSETRON HCL 4 MG PO TABS
4.0000 mg | ORAL_TABLET | Freq: Four times a day (QID) | ORAL | Status: DC | PRN
  Administered 2022-05-04: 4 mg via ORAL
  Filled 2022-05-04 (×3): qty 1

## 2022-05-04 MED ORDER — SODIUM CHLORIDE 0.9 % IV SOLN: INTRAVENOUS | Status: AC

## 2022-05-04 MED ORDER — LEVOFLOXACIN IN D5W 750 MG/150ML IV SOLN
750.0000 mg | INTRAVENOUS | Status: DC
Start: 2022-05-04 — End: 2022-05-06
  Administered 2022-05-04 – 2022-05-05 (×2): 750 mg via INTRAVENOUS
  Filled 2022-05-04 (×6): qty 150

## 2022-05-04 MED ORDER — INSULIN ASPART 100 UNIT/ML IJ SOLN
0.0000 [IU] | Freq: Three times a day (TID) | INTRAMUSCULAR | Status: DC
  Administered 2022-05-04: 8 [IU] via SUBCUTANEOUS
  Administered 2022-05-04: 3 [IU] via SUBCUTANEOUS
  Administered 2022-05-04: 5 [IU] via SUBCUTANEOUS
  Administered 2022-05-05: 3 [IU] via SUBCUTANEOUS
  Administered 2022-05-05: 2 [IU] via SUBCUTANEOUS
  Administered 2022-05-05 – 2022-05-06 (×2): 3 [IU] via SUBCUTANEOUS
  Administered 2022-05-06: 5 [IU] via SUBCUTANEOUS
  Filled 2022-05-04 (×8): qty 1

## 2022-05-04 NOTE — Progress Notes (Addendum)
Inpatient Diabetes Program Recommendations  AACE/ADA: New Consensus Statement on Inpatient Glycemic Control   Target Ranges:  Prepandial:   less than 140 mg/dL      Peak postprandial:   less than 180 mg/dL (1-2 hours)      Critically ill patients:  140 - 180 mg/dL    Latest Reference Range & Units 05/03/22 21:38  Glucose-Capillary 70 - 99 mg/dL 557 (H)    Latest Reference Range & Units 05/03/22 21:36  Glucose 70 - 99 mg/dL 322 (H)   Review of Glycemic Control  Diabetes history: DM2 Outpatient Diabetes medications: Levemir 23 units QHS (not taking), Novolog 12 units TID with meals (not taking), Metformin 1000 mg BID, Tradjenta 5 mg daily Current orders for Inpatient glycemic control: Novolog 0-15 units TID with meals, Novolog 0-5 units QHS  Inpatient Diabetes Program Recommendations:    Insulin: Please consider ordering Levemir 10 units Q24H.  HbgA1C: Current A1C in process.  Outpatient: In reviewing chart and after talking with Joycelyn Man, it was noted patient had been on Levemir and Novolog which were stopped by [facility] in May 2022 and oral DM meds prescribed. May need to prescribe insulin at discharge; if so, provider will need to order insulin pens with a note that patient can self administer at group home.  NOTE: Per chart, patient is from group home and has developmental disability. Admitted with sepsis and pyelonephritis. Patient has DM2 hx and per home medication list is taking Metformin and Tradjenta for DM control outpatient. Initial lab glucose 360 mg/dl at 02:54 on 01/03/05.  Addendum 05/04/22@11 :45--Called Joycelyn Man (with [employer]) and spoke with her about DM medications patient is taking. She states that patient has only been taking Metformin and Tradjenta recently. She states that patient was a resident there and went to [facility] for a while. Before she went to [facility], patient was taking Levemir and  Novolog with meals (insulin pens). When she was discharged from [facility] and returned to [facility] in May 2022, she was only prescribed Metformin and Tradjenta for DM control. Zella Ball noted that when patient was ordered insulin, patient was able to self inject insulin with staff supervision (using insulin pens).  Zella Ball also reports that patient's glucose was not being checked because they were told that was no longer necessary since she was not on insulin. Discussed initial glucose of 360 mg/dl on labs and explained that she may need to resume insulin outpatient. Zella Ball stated that if insulin is prescribed at discharge, provider would need to order for insulin pens and note patient can self administer.   Thanks, Orlando Penner, RN, MSN, CDCES Diabetes Coordinator Inpatient Diabetes Program [phone] (Team Pager from 8am to 5pm)

## 2022-05-04 NOTE — Progress Notes (Signed)
CLOZAPINE MONITORING (reflects NEW REMS GUIDELINES EFFECTIVE 09/07/2014):  05/04/22: ANC 8500  Check ANC at least weekly while inpatient.  For general population patients, i.e., those without benign ethnic neutropenia (BEN): --If [place] 1000-1499, increase ANC monitoring to 3x/wk --If ANC < 1000, HOLD CLOZAPINE and get psych consult   For patients with BEN: --If [place], increase ANC monitoring to 3x/wk --If ANC < 500, HOLD CLOZAPINE and get psych consult  REMS-certified psychiatry provider can continue drug with [place] below cited thresholds if they document medical opinion that the neutropenia is not clozapine-induced (heme consult is recommended) or that risk of interrupting therapy is greater than the risk of developing severe neutropenia.  --SEE PRESCRIBING INFORMATION FOR ADDITIONAL DETAILS  Clozapine dose: 250 mg po HS Pt Zip code: [id number] Prescriber: Prudy Feeler, MD (restarting) DEA or NPI #: [id number] [place] monitoring frequency: once weekly  Values have been reported via REMS reporting system 05/04/2022

## 2022-05-04 NOTE — ED Notes (Signed)
Pt resting quietly on stretcher with lights off to enhance rest. Pt c/o feeling cold. Temp re-checked and medication provided. Iv fluid infusing without difficulty and PureWick in place. Provided for pt comfort and safety and will continue to assess.

## 2022-05-04 NOTE — Assessment & Plan Note (Signed)
Sepsis fluids Aztreonam Follow cultures

## 2022-05-04 NOTE — Progress Notes (Signed)
Pharmacy Antibiotic Note  Alexandria Reynolds is a 31 y.o. female admitted on 05/03/2022 with UTI.  Pharmacy has been consulted for Aztreonam dosing.  Plan: Aztreonam 2 gm IV Q8H ordered to start on 6/8 @ 2329.   Height: 5\' 7"  (170.2 cm) Weight: 98 kg (216 lb 0.8 oz) IBW/kg (Calculated) : 61.6  Temp (24hrs), Avg:101 F (38.3 C), Min:99.3 F (37.4 C), Max:102.6 F (39.2 C)  Recent Labs  Lab 05/03/22 2136 05/03/22 2301  WBC 16.1*  --   CREATININE 0.79  --   LATICACIDVEN  --  1.5    Estimated Creatinine Clearance: 123.7 mL/min (by C-G formula based on SCr of 0.79 mg/dL).    Allergies  Allergen Reactions   Penicillins Rash    Did it involve swelling of the face/tongue/throat, SOB, or low BP? No Did it involve sudden or severe rash/hives, skin peeling, or any reaction on the inside of your mouth or nose? Yes Did you need to seek medical attention at a hospital or doctor's office? No When did it last happen?       If all above answers are "NO", may proceed with cephalosporin use.    Antimicrobials this admission:   >>    >>   Dose adjustments this admission:   Microbiology results:  BCx:   UCx:    Sputum:    MRSA PCR:   Thank you for allowing pharmacy to be a part of this patient's care.  Mahagony Grieb D 05/04/2022 4:02 AM

## 2022-05-04 NOTE — Assessment & Plan Note (Signed)
Sliding scale insulin coverage Follow A1c 

## 2022-05-04 NOTE — H&P (Signed)
History and Physical    Patient: Alexandria Reynolds ZOX:[id number] DOB: [birth year] DOA: 05/03/2022 DOS: the patient was seen and examined on 05/04/2022 PCP: Galvin Proffer, MD  Patient coming from:  Group home  Chief Complaint:  Chief Complaint  Patient presents with   Emesis    HPI: Alexandria Reynolds is a 31 y.o. female with medical history significant for Bipolar affective disorder and developmental disability, insulin-dependent type 2 diabetes, resident of a group home who was brought to the ED with a 2-day history of nausea and vomiting.  History is limited due to intellectual disability and patient only nods or shakes heads to questions. ED course and data review: Tmax 102.6 with pulse 116 respirations up to 27, BP 146/74 and O2 sat 98% on room air Labs significant for WBC 16,000 with lactic acid 1.5.  Hemoglobin 11.6 down from baseline of 13.6 a year ago.  Lipase and LFTs WNL, glucose 360.  COVID-negative.  EKG, personally viewed and interpreted with sinus tachycardia at 124 with nonspecific ST-T wave changes.  Chest x-ray nonacute.  CT abdomen and pelvis with contrast significant for right pyelonephritis. Patient started on aztreonam due to penicillin allergy given fluid boluses.  Hospitalist consulted for admission.      Past Medical History:  Diagnosis Date   ADHD (attention deficit hyperactivity disorder)    Bipolar disorder (HCC)    Depression    Diabetes mellitus, type II (HCC)    Disorder of intellectual development    Schizoaffective disorder (HCC)    No past surgical history on file. Social History:  reports that she has been smoking cigarettes. She started smoking about 8 years ago. She has been smoking an average of .05 packs per day. She has never used smokeless tobacco. She reports that she does not drink alcohol and does not use drugs.  Allergies  Allergen Reactions   Penicillins Rash    Did it involve swelling of the face/tongue/throat, SOB, or low BP? No Did it  involve sudden or severe rash/hives, skin peeling, or any reaction on the inside of your mouth or nose? Yes Did you need to seek medical attention at a hospital or doctor's office? No When did it last happen?       If all above answers are "NO", may proceed with cephalosporin use.    Family History  Problem Relation Age of Onset   Drug abuse Mother    Alcohol abuse Mother    Alcohol abuse Brother    Drug abuse Brother    Alcohol abuse Brother     Prior to Admission medications   Medication Sig Start Date End Date Taking? Authorizing Provider  benztropine (COGENTIN) 0.5 MG tablet Take 1 tablet (0.5 mg total) by mouth 2 (two) times daily as needed for tremors. 08/21/20   Charm Rings, NP  cephALEXin (KEFLEX) 500 MG capsule Take 1 capsule (500 mg total) by mouth 3 (three) times daily. 07/06/21   Sharman Cheek, MD  DULoxetine (CYMBALTA) 20 MG capsule Take 1 capsule (20 mg total) by mouth daily. 08/22/20   Charm Rings, NP  famotidine (PEPCID) 20 MG tablet Take 1 tablet (20 mg total) by mouth 2 (two) times daily. 07/06/21   Sharman Cheek, MD  guaiFENesin (ROBITUSSIN) 100 MG/5ML SOLN Take 5 mLs (100 mg total) by mouth every 4 (four) hours as needed for cough or to loosen phlegm. 06/14/21   Koleen Distance, MD  hydrOXYzine (ATARAX/VISTARIL) 25 MG tablet Take 25 mg by mouth  daily. 03/26/21   [provider]  insulin aspart (NOVOLOG) 100 UNIT/ML injection Inject 12 Units into the skin 3 (three) times daily with meals. Patient not taking: Reported on 06/14/2021 07/09/20   Clapacs, Jackquline Denmark, MD  insulin detemir (LEVEMIR) 100 UNIT/ML injection Inject 0.23 mLs (23 Units total) into the skin at bedtime. Patient not taking: Reported on 06/14/2021 07/09/20   Clapacs, Jackquline Denmark, MD  lamoTRIgine (LAMICTAL) 100 MG tablet Take 100 mg by mouth 2 (two) times daily. 03/25/21   [provider]  lamoTRIgine (LAMICTAL) 200 MG tablet Take 1 tablet (200 mg total) by mouth daily. 07/09/20   Clapacs,  Jackquline Denmark, MD  loperamide (IMODIUM) 2 MG capsule Take 1 capsule (2 mg total) by mouth 4 (four) times daily as needed for diarrhea or loose stools. 06/14/21   Koleen Distance, MD  loratadine (CLARITIN) 10 MG tablet Take 1 tablet (10 mg total) by mouth daily. 07/10/20   Clapacs, Jackquline Denmark, MD  LORazepam (ATIVAN) 0.5 MG tablet Take 0.5 mg by mouth 2 (two) times daily as needed. 04/13/21   [provider]  LORazepam (ATIVAN) 1 MG tablet Take 1 tablet (1 mg total) by mouth daily as needed. 07/09/20   Clapacs, Jackquline Denmark, MD  metFORMIN (GLUCOPHAGE) 1000 MG tablet Take 1 tablet by mouth 2 (two) times daily. 04/01/21   [provider]  metFORMIN (GLUCOPHAGE) 500 MG tablet Take 1 tablet (500 mg total) by mouth 2 (two) times daily with a meal. 07/09/20   Clapacs, Jackquline Denmark, MD  metoCLOPramide (REGLAN) 10 MG tablet Take 10 mg by mouth at bedtime. 07/28/20   [provider]  MYRBETRIQ 50 MG TB24 tablet Take 50 mg by mouth daily. 07/28/20   [provider]  ondansetron (ZOFRAN ODT) 4 MG disintegrating tablet Take 1 tablet (4 mg total) by mouth every 8 (eight) hours as needed for nausea or vomiting. 07/06/21   Sharman Cheek, MD  potassium chloride (KLOR-CON) 10 MEQ tablet Take 1 tablet (10 mEq total) by mouth daily. 08/17/20   Willy Eddy, MD  RISPERDAL 1 MG tablet Take 1 tablet (1 mg total) by mouth daily. 04/22/21 04/22/22  Leevy-Johnson, Lina Sar, NP  RISPERDAL 2 MG tablet Take 1 tablet (2 mg total) by mouth at bedtime for 14 days. 04/22/21 05/06/21  Leevy-Johnson, Lina Sar, NP  risperiDONE (RISPERDAL) 2 MG tablet Take 1 tablet (2 mg total) by mouth at bedtime. 08/21/20   Charm Rings, NP  sertraline (ZOLOFT) 100 MG tablet Take by mouth. 04/01/21   [provider]  TRADJENTA 5 MG TABS tablet Take 5 mg by mouth daily. 04/01/21   [provider]  traZODone (DESYREL) 100 MG tablet Take 1 tablet (100 mg total) by mouth at bedtime. 07/09/20   Clapacs, Jackquline Denmark, MD  traZODone (DESYREL)  150 MG tablet Take 150 mg by mouth at bedtime. 04/01/21   [provider]    Physical Exam: Vitals:   05/04/22 0200 05/04/22 0215 05/04/22 0230 05/04/22 0300  BP: 137/80   127/71  Pulse: (!) 111 (!) 102  (!) 110  Resp: (!) 21 19  20   Temp:   99.3 F (37.4 C)   TempSrc:      SpO2: 96% 100%  100%  Weight:      Height:       Physical Exam Vitals and nursing note reviewed.  Constitutional:      General: She is not in acute distress. HENT:     Head:  Normocephalic and atraumatic.  Cardiovascular:     Rate and Rhythm: Normal rate and regular rhythm.     Heart sounds: Normal heart sounds.  Pulmonary:     Effort: Pulmonary effort is normal.     Breath sounds: Normal breath sounds.  Abdominal:     Palpations: Abdomen is soft.     Tenderness: There is no abdominal tenderness.  Neurological:     Mental Status: Mental status is at baseline.     Labs on Admission: I have personally reviewed following labs and imaging studies  CBC: Recent Labs  Lab 05/03/22 2136  WBC 16.1*  HGB 11.6*  HCT 35.5*  MCV 82.6  PLT 241   Basic Metabolic Panel: Recent Labs  Lab 05/03/22 2136  NA 131*  K 3.5  CL 98  CO2 20*  GLUCOSE 360*  BUN 15  CREATININE 0.79  CALCIUM 8.9   GFR: Estimated Creatinine Clearance: 123.7 mL/min (by C-G formula based on SCr of 0.79 mg/dL). Liver Function Tests: Recent Labs  Lab 05/03/22 2136  AST 31  ALT 23  ALKPHOS 51  BILITOT 0.5  PROT 9.4*  ALBUMIN 3.8   Recent Labs  Lab 05/03/22 2136  LIPASE 26   No results for input(s): "AMMONIA" in the last 168 hours. Coagulation Profile: No results for input(s): "INR", "PROTIME" in the last 168 hours. Cardiac Enzymes: No results for input(s): "CKTOTAL", "CKMB", "CKMBINDEX", "TROPONINI" in the last 168 hours. BNP (last 3 results) No results for input(s): "PROBNP" in the last 8760 hours. HbA1C: No results for input(s): "HGBA1C" in the last 72 hours. CBG: Recent Labs  Lab 05/03/22 2138   GLUCAP 375*   Lipid Profile: No results for input(s): "CHOL", "HDL", "LDLCALC", "TRIG", "CHOLHDL", "LDLDIRECT" in the last 72 hours. Thyroid Function Tests: No results for input(s): "TSH", "T4TOTAL", "FREET4", "T3FREE", "THYROIDAB" in the last 72 hours. Anemia Panel: No results for input(s): "VITAMINB12", "FOLATE", "FERRITIN", "TIBC", "IRON", "RETICCTPCT" in the last 72 hours. Urine analysis:    Component Value Date/Time   COLORURINE YELLOW (A) 07/06/2021 1021   APPEARANCEUR CLOUDY (A) 07/06/2021 1021   LABSPEC 1.029 07/06/2021 1021   PHURINE 5.0 07/06/2021 1021   GLUCOSEU 50 (A) 07/06/2021 1021   HGBUR LARGE (A) 07/06/2021 1021   BILIRUBINUR NEGATIVE 07/06/2021 1021   KETONESUR 5 (A) 07/06/2021 1021   PROTEINUR 30 (A) 07/06/2021 1021   NITRITE NEGATIVE 07/06/2021 1021   LEUKOCYTESUR NEGATIVE 07/06/2021 1021    Radiological Exams on Admission: CT ABDOMEN PELVIS W CONTRAST  Result Date: 05/04/2022 CLINICAL DATA:  Abdominal pain and fever. EXAM: CT ABDOMEN AND PELVIS WITH CONTRAST TECHNIQUE: Multidetector CT imaging of the abdomen and pelvis was performed using the standard protocol following bolus administration of intravenous contrast. RADIATION DOSE REDUCTION: This exam was performed according to the departmental dose-optimization program which includes automated exposure control, adjustment of the mA and/or kV according to patient size and/or use of iterative reconstruction technique. CONTRAST:  OMNIPAQUE IOHEXOL 300 MG/ML  SOLN COMPARISON:  Right upper quadrant ultrasound dated 06/15/2020. FINDINGS: Lower chest: Trace right pleural effusion. The visualized lung bases are otherwise clear. No intra-abdominal free air or free fluid. Hepatobiliary: Diffuse fatty liver. No intrahepatic biliary dilatation. The gallbladder is predominantly contracted. No calcified gallstone. Pancreas: Unremarkable. No pancreatic ductal dilatation or surrounding inflammatory changes. Spleen: Normal in  size without focal abnormality. Adrenals/Urinary Tract: Adrenal glands are unremarkable. There is heterogeneous enhancement of the right renal parenchyma with right perinephric stranding consistent with pyelonephritis. No abscess. The left  kidney is unremarkable. There is mild fullness of the right ureter with right periureteric stranding. The left ureter and urinary bladder appear unremarkable Stomach/Bowel: There is no bowel obstruction or active inflammation. No evidence of acute appendicitis. Vascular/Lymphatic: The abdominal aorta and IVC are unremarkable. No portal venous gas. There is no adenopathy. Reproductive: The uterus is anteverted and grossly unremarkable. No adnexal masses. Other: Small fat containing umbilical hernia. Musculoskeletal: No acute or significant osseous findings. IMPRESSION: 1. Right pyelonephritis. No abscess. 2. No bowel obstruction. 3. Fatty liver. 4. Trace right pleural effusion. Electronically Signed   By: Elgie CollardArash  Radparvar M.D.   On: 05/04/2022 00:50   DG Chest Portable 1 View  Result Date: 05/03/2022 CLINICAL DATA:  Fever. EXAM: PORTABLE CHEST 1 VIEW COMPARISON:  Chest radiograph dated 04/18/2021. FINDINGS: Shallow inspiration. No focal consolidation, pleural effusion, or pneumothorax. Top-normal cardiac silhouette. No acute osseous pathology. IMPRESSION: No active cardiopulmonary disease. Electronically Signed   By: Elgie CollardArash  Radparvar M.D.   On: 05/03/2022 23:32     Data Reviewed: Relevant notes from primary care and specialist visits, past discharge summaries as available in EHR, including Care Everywhere. Prior diagnostic testing as pertinent to current admission diagnoses Updated medications and problem lists for reconciliation ED course, including vitals, labs, imaging, treatment and response to treatment Triage notes, nursing and pharmacy notes and ED provider's notes Notable results as noted in HPI   Assessment and Plan: * Sepsis secondary to acute  pyelonephritis(HCC) Sepsis fluids Aztreonam Follow cultures  Uncontrolled type 2 diabetes mellitus with hyperglycemia, with long-term current use of insulin (HCC) Sliding scale insulin coverage Follow A1c  Developmental disability Additional nursing assistance for communication  Bipolar I disorder (HCC) Continue home meds pending med rec  Asthma, mild intermittent Albuterol as needed    DVT prophylaxis: Lovenox  Consults: none  Advance Care Planning:   Code Status: Full Code   Family Communication: none  Disposition Plan: Back to previous home environment  Severity of Illness: The appropriate patient status for this patient is INPATIENT. Inpatient status is judged to be reasonable and necessary in order to provide the required intensity of service to ensure the patient's safety. The patient's presenting symptoms, physical exam findings, and initial radiographic and laboratory data in the context of their chronic comorbidities is felt to place them at high risk for further clinical deterioration. Furthermore, it is not anticipated that the patient will be medically stable for discharge from the hospital within 2 midnights of admission.   * I certify that at the point of admission it is my clinical judgment that the patient will require inpatient hospital care spanning beyond 2 midnights from the point of admission due to high intensity of service, high risk for further deterioration and high frequency of surveillance required.*  Author: Andris BaumannHazel V Laxmi Choung, MD 05/04/2022 3:34 AM  For on call review [email or web address].

## 2022-05-04 NOTE — Assessment & Plan Note (Signed)
Continue home meds pending med rec 

## 2022-05-04 NOTE — Assessment & Plan Note (Signed)
Additional nursing assistance for communication

## 2022-05-04 NOTE — Assessment & Plan Note (Signed)
Albuterol as needed 

## 2022-05-04 NOTE — Progress Notes (Signed)
Patient seen and examined at the bedside in the emergency room.  She complains of right flank pain.  Change IV aztreonam to IV ceftriaxone for sepsis due to pyelonephritis.  Start Levemir for type II DM with hyperglycemia.  Use NovoLog as needed.  Hemoglobin A1c is 7.  Continue psychotropics for bipolar disorder

## 2022-05-04 NOTE — Progress Notes (Signed)
Dr. Myriam Forehand notified of high temp pt was running, 103.2.  Tylenol was given and 1000cc bolus ordered and given.  Rechecked temp after these were given, 101.7.  [facility] notified, awaiting orders.

## 2022-05-04 NOTE — Progress Notes (Signed)
Anticoagulation monitoring(Lovenox):  31 yo female ordered Lovenox 40 mg Q24h    Filed Weights   05/03/22 2135  Weight: 98 kg (216 lb 0.8 oz)   BMI 33.84    Lab Results  Component Value Date   CREATININE 0.79 05/03/2022   CREATININE 0.61 07/06/2021   CREATININE 0.78 06/14/2021   Estimated Creatinine Clearance: 123.7 mL/min (by C-G formula based on SCr of 0.79 mg/dL). Hemoglobin & Hematocrit     Component Value Date/Time   HGB 11.6 (L) 05/03/2022 2136   HCT 35.5 (L) 05/03/2022 2136     Per Protocol for Patient with estCrcl > 30 ml/min and BMI > 30, will transition to Lovenox 50 mg Q24h.

## 2022-05-05 DIAGNOSIS — N390 Urinary tract infection, site not specified: Secondary | ICD-10-CM | POA: Diagnosis not present

## 2022-05-05 DIAGNOSIS — A419 Sepsis, unspecified organism: Secondary | ICD-10-CM | POA: Diagnosis not present

## 2022-05-05 DIAGNOSIS — N10 Acute tubulo-interstitial nephritis: Secondary | ICD-10-CM | POA: Diagnosis not present

## 2022-05-05 LAB — BASIC METABOLIC PANEL
Anion gap: 6 (ref 5–15)
BUN: 8 mg/dL (ref 6–20)
CO2: 25 mmol/L (ref 22–32)
Calcium: 8.5 mg/dL — ABNORMAL LOW (ref 8.9–10.3)
Chloride: 106 mmol/L (ref 98–111)
Creatinine, Ser: 0.7 mg/dL (ref 0.44–1.00)
GFR, Estimated: >60 mL/min (ref >60)
Glucose, Bld: 155 mg/dL — ABNORMAL HIGH (ref 70–99)
Potassium: 3.3 mmol/L — ABNORMAL LOW (ref 3.5–5.1)
Sodium: 137 mmol/L (ref 135–145)

## 2022-05-05 LAB — MAGNESIUM: Magnesium: 1.9 mg/dL (ref 1.7–2.4)

## 2022-05-05 LAB — GLUCOSE, CAPILLARY
Glucose-Capillary: 142 mg/dL — ABNORMAL HIGH (ref 70–99)
Glucose-Capillary: 176 mg/dL — ABNORMAL HIGH (ref 70–99)
Glucose-Capillary: 163 mg/dL — ABNORMAL HIGH (ref 70–99)
Glucose-Capillary: 241 mg/dL — ABNORMAL HIGH (ref 70–99)

## 2022-05-05 MED ORDER — POTASSIUM CHLORIDE CRYS ER 20 MEQ PO TBCR
40.0000 meq | EXTENDED_RELEASE_TABLET | Freq: Once | ORAL | Status: AC
Start: 2022-05-05 — End: 2022-05-05
  Administered 2022-05-05: 40 meq via ORAL
  Filled 2022-05-05: qty 2

## 2022-05-05 NOTE — TOC Initial Note (Signed)
Transition of Care [facility]) - Initial/Assessment Note    Patient Details  Name: Alexandria Reynolds MRN: [id number] Date of Birth: Dec 09, 1990  [facility]) CM/SW Contact:    Liliana Cline, LCSW Phone Number: 05/05/2022, 9:41 AM  Clinical Narrative:                 CSW called and spoke with patient's Legal Guardian Trinna Balloon at [phone]. Maralyn Sago confirmed patient is from [facility] and the plan is for patient to return there when medically stable. Maralyn Sago stated if patient is DC over the night/weekends, TOC would need to call on call Guardianship line at [phone] or [phone]. Maralyn Sago stated the group home should pick patient up when DC.  CSW also called [facility] to confirm address and that they can pick patient up when DC. Spoke to [employer] who confirmed address is [address], [place], [place] and that they can pick patient up when DC. Zella Ball stated the number to call when patient is DC (or with updates) is [phone].  TOC handoff updated.   Expected Discharge Plan: Group Home Barriers to Discharge: Continued Medical Work up   Patient Goals and CMS Choice Patient states their goals for this hospitalization and ongoing recovery are:: return to group home CMS Medicare.gov Compare Post Acute Care list provided to:: Patient Represenative (must comment) Choice offered to / list presented to : [facility] POA / Guardian  Expected Discharge Plan and Services Expected Discharge Plan: Group Home       Living arrangements for the past 2 months: Group Home                                      Prior Living Arrangements/Services Living arrangements for the past 2 months: Group Home Lives with:: Facility Resident Patient language and need for interpreter reviewed:: Yes Do you feel safe going back to the place where you live?: Yes      Need for Family Participation in Patient Care: Yes (Comment) Care giver support  system in place?: Yes (comment)   Criminal Activity/Legal Involvement Pertinent to Current Situation/Hospitalization: No - Comment as needed  Activities of Daily Living      Permission Sought/Granted Permission sought to share information with : Facility Industrial/product designer granted to share information with : Yes, Designer, fashion/clothing (by Legal Guardian Trinna Balloon)     Permission granted to share info w AGENCY: [facility]        Emotional Assessment         Alcohol / Substance Use: Not Applicable Psych Involvement: No (comment)  Admission diagnosis:  Sepsis secondary to UTI (HCC) [A41.9, N39.0] Fever, unspecified fever cause [R50.9] Patient Active Problem List   Diagnosis Date Noted   Sepsis secondary to acute pyelonephritis(HCC) 05/04/2022   ADHD (attention deficit hyperactivity disorder)    Acute pyelonephritis    Behavior concern in adult    Schizoaffective disorder (HCC) 07/09/2020   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   Developmental disability 08/01/2016   Bipolar I disorder (HCC) 03/28/2016   Bipolar affective disorder (HCC) 02/15/2016   Schizophrenia (HCC) 02/15/2016   Asthma, mild intermittent 09/19/2013   Absence of menstruation 07/08/2013   Uncontrolled type 2 diabetes mellitus with hyperglycemia, with long-term current use of insulin (HCC) 07/08/2013   H/O infectious disease 07/08/2013   Adiposity  07/08/2013   Current smoker 07/08/2013   PCP:  Galvin Proffer, MD Pharmacy:   [facility] - [place], [place] - [address] [place] [address] Phone: [phone] Fax: [phone]     Social Determinants of Health (SDOH) Interventions    Readmission Risk Interventions     No data to display

## 2022-05-05 NOTE — Progress Notes (Addendum)
Progress Note    Alexandria Reynolds  QMG:[id number] DOB: 04-25-1991  DOA: 05/03/2022 PCP: Galvin Proffer, MD      Brief Narrative:    Medical records reviewed and are as summarized below:  Alexandria Reynolds is a 31 y.o. female with medical history significant for bipolar disorder, intellectual disability, type II DM, mild intermittent asthma, who was brought from a group home to the hospital because of nausea and vomiting.  She was febrile, tachycardic and tachypneic in the emergency room.  She was admitted to the hospital for sepsis secondary to acute pyelonephritis.      Assessment/Plan:   Principal Problem:   Sepsis secondary to acute pyelonephritis(HCC) Active Problems:   Uncontrolled type 2 diabetes mellitus with hyperglycemia, with long-term current use of insulin (HCC)   Acute pyelonephritis   Asthma, mild intermittent   Bipolar I disorder (HCC)   Developmental disability    Body mass index is 33.84 kg/m.  (Obesity)  Sepsis secondary to acute pyelonephritis: Discontinue IV ceftriaxone.  Continue IV Levaquin.  Follow-up urine and blood cultures.  Type II DM with hyperglycemia: Continue Semglee and NovoLog.  Hypokalemia: Replete potassium and monitor levels.  Bipolar disorder: Continue psychotropics  Intellectual disability: Continue supportive care  Diet Order             Diet heart healthy/carb modified Room service appropriate? Yes; Fluid consistency: Thin  Diet effective now                            Consultants: None  Procedures: None    Medications:    cloZAPine  250 mg Oral QHS   enoxaparin (LOVENOX) injection  0.5 mg/kg Subcutaneous Q24H   insulin aspart  0-15 Units Subcutaneous TID WC   insulin aspart  0-5 Units Subcutaneous QHS   insulin detemir  10 Units Subcutaneous Daily   lamoTRIgine  200 mg Oral QHS   risperiDONE  2 mg Oral QHS   Continuous Infusions:  levofloxacin (LEVAQUIN) IV Stopped (05/04/22 2014)      Anti-infectives (From admission, onward)    Start     Dose/Rate Route Frequency Ordered Stop   05/04/22 1800  levofloxacin (LEVAQUIN) IVPB 750 mg        750 mg 100 mL/hr over 90 Minutes Intravenous Every 24 hours 05/04/22 1644     05/04/22 1400  cefTRIAXone (ROCEPHIN) 2 g in sodium chloride 0.9 % 100 mL IVPB  Status:  Discontinued        2 g 200 mL/hr over 30 Minutes Intravenous Every 24 hours 05/04/22 1217 05/05/22 1258   05/03/22 2230  aztreonam (AZACTAM) 2 g in sodium chloride 0.9 % 100 mL IVPB  Status:  Discontinued        2 g 200 mL/hr over 30 Minutes Intravenous Every 8 hours 05/03/22 2221 05/04/22 1217   05/03/22 2230  vancomycin (VANCOCIN) IVPB 1000 mg/200 mL premix        1,000 mg 200 mL/hr over 60 Minutes Intravenous  Once 05/03/22 2221 05/04/22 0227   05/03/22 2230  metroNIDAZOLE (FLAGYL) IVPB 500 mg        500 mg 100 mL/hr over 60 Minutes Intravenous  Once 05/03/22 2221 05/04/22 0314              Family Communication/Anticipated D/C date and plan/Code Status   DVT prophylaxis:      Code Status: Full Code  Family Communication: None Disposition Plan: None  Status is: Inpatient Remains inpatient appropriate because: IV antibiotics       Subjective:   Interval events noted.  She complains of lower abdominal pain and right flank pain.  Objective:    Vitals:   05/04/22 2037 05/05/22 0337 05/05/22 0740 05/05/22 1211  BP: 134/85 (!) 137/93 135/83 118/82  Pulse: (!) 101 (!) 104 (!) 101 99  Resp: Temp: 99.4 F (37.4 C) 98.9 F (37.2 C) 98.5 F (36.9 C) 98.2 F (36.8 C)  TempSrc:   Oral   SpO2: 100% 97% 99% 99%  Weight:      Height:       No data found.   Intake/Output Summary (Last 24 hours) at 05/05/2022 1417 Last data filed at 05/05/2022 0746 Gross per 24 hour  Intake 150 ml  Output 600 ml  Net -450 ml   Filed Weights   05/03/22 2135  Weight: 98 kg    Exam:  GEN: NAD SKIN: No rash EYES: EOMI ENT: MMM CV:  RRR PULM: CTA B ABD: soft, obese, lower abdominal tenderness, right flank tenderness, +BS CNS: AAO x 3, non focal EXT: No edema or tenderness        Data Reviewed:   I have personally reviewed following labs and imaging studies:  Labs: Labs show the following:   Basic Metabolic Panel: Recent Labs  Lab 05/03/22 2136 05/04/22 0649 05/05/22 0425  NA 131*  --  137  K 3.5  --  3.3*  CL 98  --  106  CO2 20*  --  25  GLUCOSE 360*  --  155*  BUN 15  --  8  CREATININE 0.79 0.77 0.70  CALCIUM 8.9  --  8.5*   GFR Estimated Creatinine Clearance: 123.7 mL/min (by C-G formula based on SCr of 0.7 mg/dL). Liver Function Tests: Recent Labs  Lab 05/03/22 2136  AST 31  ALT 23  ALKPHOS 51  BILITOT 0.5  PROT 9.4*  ALBUMIN 3.8   Recent Labs  Lab 05/03/22 2136  LIPASE 26   No results for input(s): "AMMONIA" in the last 168 hours. Coagulation profile Recent Labs  Lab 05/04/22 0650  INR 1.3*    CBC: Recent Labs  Lab 05/03/22 2136 05/04/22 1528  WBC 16.1* 11.4*  NEUTROABS  --  8.5*  HGB 11.6* 10.9*  HCT 35.5* 33.8*  MCV 82.6 83.0  PLT 241 238   Cardiac Enzymes: No results for input(s): "CKTOTAL", "CKMB", "CKMBINDEX", "TROPONINI" in the last 168 hours. BNP (last 3 results) No results for input(s): "PROBNP" in the last 8760 hours. CBG: Recent Labs  Lab 05/04/22 1458 05/04/22 1744 05/04/22 2206 05/05/22 0742 05/05/22 1219  GLUCAP 128* 158* 178* 142* 176*   D-Dimer: No results for input(s): "DDIMER" in the last 72 hours. Hgb A1c: Recent Labs    05/04/22 0649  HGBA1C 7.0*   Lipid Profile: No results for input(s): "CHOL", "HDL", "LDLCALC", "TRIG", "CHOLHDL", "LDLDIRECT" in the last 72 hours. Thyroid function studies: No results for input(s): "TSH", "T4TOTAL", "T3FREE", "THYROIDAB" in the last 72 hours.  Invalid input(s): "FREET3" Anemia work up: No results for input(s): "VITAMINB12", "FOLATE", "FERRITIN", "TIBC", "IRON", "RETICCTPCT" in the last 72  hours. Sepsis Labs: Recent Labs  Lab 05/03/22 2136 05/03/22 2301 05/04/22 0650 05/04/22 1528  PROCALCITON  --   --  1.84  --   WBC 16.1*  --   --  11.4*  LATICACIDVEN  --  1.5  --   --  Microbiology Recent Results (from the past 240 hour(s))  Blood culture (routine x 2)     Status: None (Preliminary result)   Collection Time: 05/03/22 11:01 PM   Specimen: BLOOD  Result Value Ref Range Status   Specimen Description BLOOD LEFT FA  Final   Special Requests   Final    BOTTLES DRAWN AEROBIC AND ANAEROBIC Blood Culture adequate volume   Culture   Final    NO GROWTH 2 DAYS Performed at [facility] [address] [place], [place] [address]    Report Status PENDING  Incomplete  Blood culture (routine x 2)     Status: None (Preliminary result)   Collection Time: 05/03/22 11:20 PM   Specimen: BLOOD  Result Value Ref Range Status   Specimen Description BLOOD RIGHT HAND  Final   Special Requests   Final    BOTTLES DRAWN AEROBIC AND ANAEROBIC Blood Culture adequate volume   Culture   Final    NO GROWTH 2 DAYS Performed at [facility], [address] [place], [place] [address]    Report Status PENDING  Incomplete  SARS Coronavirus 2 by RT PCR (hospital order, performed in [facility] hospital lab) *cepheid single result test* Anterior Nasal Swab     Status: None   Collection Time: 05/03/22 11:32 PM   Specimen: Anterior Nasal Swab  Result Value Ref Range Status   SARS Coronavirus 2 by RT PCR NEGATIVE NEGATIVE Final    Comment: (NOTE) SARS-CoV-2 target nucleic acids are NOT DETECTED.  The SARS-CoV-2 RNA is generally detectable in upper and lower respiratory specimens during the acute phase of infection. The lowest concentration of SARS-CoV-2 viral copies this assay can detect is 250 copies / mL. A negative result does not preclude SARS-CoV-2 infection and should not be used as the sole basis for treatment or other patient management decisions.  A  negative result may occur with improper specimen collection / handling, submission of specimen other than nasopharyngeal swab, presence of viral mutation(s) within the areas targeted by this assay, and inadequate number of viral copies (<250 copies / mL). A negative result must be combined with clinical observations, patient history, and epidemiological information.  Fact Sheet for Patients:   RoadLapTop.co.za  Fact Sheet for Healthcare Providers: http://kim-miller.com/  This test is not yet approved or  cleared by the [place] FDA and has been authorized for detection and/or diagnosis of SARS-CoV-2 by FDA under an Emergency Use Authorization (EUA).  This EUA will remain in effect (meaning this test can be used) for the duration of the COVID-19 declaration under Section 564(b)(1) of the Act, 21 U.S.C. section 360bbb-3(b)(1), unless the authorization is terminated or revoked sooner.  Performed at [facility], [address] [place], [place] [address]     Procedures and diagnostic studies:  CT ABDOMEN PELVIS W CONTRAST  Result Date: 05/04/2022 CLINICAL DATA:  Abdominal pain and fever. EXAM: CT ABDOMEN AND PELVIS WITH CONTRAST TECHNIQUE: Multidetector CT imaging of the abdomen and pelvis was performed using the standard protocol following bolus administration of intravenous contrast. RADIATION DOSE REDUCTION: This exam was performed according to the departmental dose-optimization program which includes automated exposure control, adjustment of the mA and/or kV according to patient size and/or use of iterative reconstruction technique. CONTRAST:  OMNIPAQUE IOHEXOL 300 MG/ML  SOLN COMPARISON:  Right upper quadrant ultrasound dated 06/15/2020. FINDINGS: Lower chest: Trace right pleural effusion. The visualized lung bases are otherwise clear. No intra-abdominal free air or free fluid.  Hepatobiliary: Diffuse fatty liver. No  intrahepatic biliary dilatation. The gallbladder is predominantly contracted. No calcified gallstone. Pancreas: Unremarkable. No pancreatic ductal dilatation or surrounding inflammatory changes. Spleen: Normal in size without focal abnormality. Adrenals/Urinary Tract: Adrenal glands are unremarkable. There is heterogeneous enhancement of the right renal parenchyma with right perinephric stranding consistent with pyelonephritis. No abscess. The left kidney is unremarkable. There is mild fullness of the right ureter with right periureteric stranding. The left ureter and urinary bladder appear unremarkable Stomach/Bowel: There is no bowel obstruction or active inflammation. No evidence of acute appendicitis. Vascular/Lymphatic: The abdominal aorta and IVC are unremarkable. No portal venous gas. There is no adenopathy. Reproductive: The uterus is anteverted and grossly unremarkable. No adnexal masses. Other: Small fat containing umbilical hernia. Musculoskeletal: No acute or significant osseous findings. IMPRESSION: 1. Right pyelonephritis. No abscess. 2. No bowel obstruction. 3. Fatty liver. 4. Trace right pleural effusion. Electronically Signed   By: Elgie CollardArash  Radparvar M.D.   On: 05/04/2022 00:50   DG Chest Portable 1 View  Result Date: 05/03/2022 CLINICAL DATA:  Fever. EXAM: PORTABLE CHEST 1 VIEW COMPARISON:  Chest radiograph dated 04/18/2021. FINDINGS: Shallow inspiration. No focal consolidation, pleural effusion, or pneumothorax. Top-normal cardiac silhouette. No acute osseous pathology. IMPRESSION: No active cardiopulmonary disease. Electronically Signed   By: Elgie CollardArash  Radparvar M.D.   On: 05/03/2022 23:32               LOS: 1 day   Latitia Housewright  [facility]   Pager on [email or web address]. If 7PM-7AM, please contact night-coverage at [email or web address]     05/05/2022, 2:17 PM

## 2022-05-06 DIAGNOSIS — N10 Acute tubulo-interstitial nephritis: Secondary | ICD-10-CM | POA: Diagnosis not present

## 2022-05-06 DIAGNOSIS — I4581 Long QT syndrome: Secondary | ICD-10-CM

## 2022-05-06 DIAGNOSIS — E1165 Type 2 diabetes mellitus with hyperglycemia: Secondary | ICD-10-CM | POA: Diagnosis not present

## 2022-05-06 DIAGNOSIS — R9431 Abnormal electrocardiogram [ECG] [EKG]: Secondary | ICD-10-CM | POA: Diagnosis present

## 2022-05-06 DIAGNOSIS — A419 Sepsis, unspecified organism: Secondary | ICD-10-CM | POA: Diagnosis not present

## 2022-05-06 DIAGNOSIS — N390 Urinary tract infection, site not specified: Secondary | ICD-10-CM | POA: Diagnosis not present

## 2022-05-06 DIAGNOSIS — Z794 Long term (current) use of insulin: Secondary | ICD-10-CM

## 2022-05-06 LAB — GLUCOSE, CAPILLARY
Glucose-Capillary: 179 mg/dL — ABNORMAL HIGH (ref 70–99)
Glucose-Capillary: 220 mg/dL — ABNORMAL HIGH (ref 70–99)

## 2022-05-06 LAB — POTASSIUM: Potassium: 4 mmol/L (ref 3.5–5.1)

## 2022-05-06 LAB — URINE CULTURE

## 2022-05-06 MED ORDER — SULFAMETHOXAZOLE-TRIMETHOPRIM 800-160 MG PO TABS: 1.0000 | ORAL_TABLET | Freq: Two times a day (BID) | ORAL | 0 refills | Status: AC

## 2022-05-06 MED ORDER — INSULIN DETEMIR 100 UNIT/ML ~~LOC~~ SOLN: 10.0000 [IU] | Freq: Every day | SUBCUTANEOUS | 0 refills | Status: AC

## 2022-05-06 MED ORDER — ONDANSETRON HCL 4 MG PO TABS: 4.0000 mg | ORAL_TABLET | Freq: Three times a day (TID) | ORAL | 0 refills | Status: AC | PRN

## 2022-05-06 MED ORDER — LAMOTRIGINE 200 MG PO TABS: 200.0000 mg | ORAL_TABLET | Freq: Every day | ORAL | Status: AC

## 2022-05-06 NOTE — TOC Progression Note (Addendum)
Transition of Care [facility]) - Progression Note    Patient Details  Name: Alexandria Reynolds MRN: [id number] Date of Birth: 01-29-1991  [facility] CM/SW Contact  Izola Price, RN Phone Number: 05/06/2022, 1:41 PM  Clinical Narrative:  6/10: Discharge orders written at 1058 am. Contacting on call guardian numbers given in [place] notes of 05/05/22. Left a VM with call back numbers. Simmie Davies RN CM  156 pm. Reached on call guardian at [phone].and faxed DC summary to [phone] pending their approval for discharge to [place] home.   252 pm. Received call back from on call guardian, Osie Bond. She gave approval for discharge back to group home, just wanted to make sure group home caretakers aware of any changes/needs/medications. Explained AVS and that group home has been contacted re pending discharge. Simmie Davies RN CM   Contact for pick up by [place] [facility] is [phone] when patient is ready for discharge and transportation back to group home.  Simmie Davies RN CM    Expected Discharge Plan: Group Home Barriers to Discharge: Continued Medical Work up  Expected Discharge Plan and Services Expected Discharge Plan: Group Home       Living arrangements for the past 2 months: Group Home Expected Discharge Date: 05/06/22                                     Social Determinants of Health (SDOH) Interventions    Readmission Risk Interventions     No data to display

## 2022-05-06 NOTE — Plan of Care (Signed)
?  Problem: Clinical Measurements: ?Goal: Ability to maintain clinical measurements within normal limits will improve ?Outcome: Progressing ?Goal: Will remain free from infection ?Outcome: Progressing ?Goal: Diagnostic test results will improve ?Outcome: Progressing ?Goal: Respiratory complications will improve ?Outcome: Progressing ?Goal: Cardiovascular complication will be avoided ?Outcome: Progressing ?  ?Problem: Nutrition: ?Goal: Adequate nutrition will be maintained ?Outcome: Progressing ?  ?Problem: Activity: ?Goal: Risk for activity intolerance will decrease ?Outcome: Progressing ?  ?Problem: Coping: ?Goal: Level of anxiety will decrease ?Outcome: Progressing ?  ?Problem: Elimination: ?Goal: Will not experience complications related to bowel motility ?Outcome: Progressing ?Goal: Will not experience complications related to urinary retention ?Outcome: Progressing ?  ?Problem: Pain Managment: ?Goal: General experience of comfort will improve ?Outcome: Progressing ?  ?Problem: Safety: ?Goal: Ability to remain free from injury will improve ?Outcome: Progressing ?  ?Problem: Skin Integrity: ?Goal: Risk for impaired skin integrity will decrease ?Outcome: Progressing ?  ?

## 2022-05-06 NOTE — Discharge Summary (Addendum)
Physician Discharge Summary   Patient: Alexandria Reynolds MRN: [id number] DOB: Aug 19, 1991  Admit date:     05/03/2022  Discharge date: 05/06/22  Discharge Physician: Lurene Shadow   PCP: Galvin Proffer, MD   Recommendations at discharge:   Follow-up with PCP in 1 week Monitor QT interval closely while on antipsychotics  Discharge Diagnoses: Principal Problem:   Sepsis secondary to acute pyelonephritis(HCC) Active Problems:   Uncontrolled type 2 diabetes mellitus with hyperglycemia, with long-term current use of insulin (HCC)   Acute pyelonephritis   Asthma, mild intermittent   Bipolar I disorder (HCC)   Developmental disability   Prolonged QT interval  Resolved Problems:   * No resolved hospital problems. [facility] Course:  Alexandria Reynolds is a 31 y.o. female with medical history significant for bipolar disorder, intellectual disability, type II DM, mild intermittent asthma, who was brought from a group home to the hospital because of nausea and vomiting.  She was febrile, tachycardic and tachypneic in the emergency room.   She was admitted to the hospital for sepsis secondary to acute pyelonephritis.  No growth on blood cultures thus far.  Urine culture showed multiple species present. Of note, urine specimen was obtained after patient had received first dose of antibiotics. She has been afebrile for more than 24 hours.  Her condition has improved and she is deemed stable for discharge today.  She requested antiemetics for nausea.  She insisted that she needs medicine to help with nausea.  She was explained the risks of worsening prolonged QT interval and potential increased risk of cardiopulmonary arrest.  She will be given 2 days worth of Zofran.  Prolonged QT interval improved from 597-467.        Consultants: None Procedures performed: None  Disposition: Group home Diet recommendation:  Discharge Diet Orders (From admission, onward)     Start     Ordered    05/06/22 0000  Diet - low sodium heart healthy        05/06/22 1058           Cardiac and Carb modified diet DISCHARGE MEDICATION: Allergies as of 05/06/2022       Reactions   Penicillins Rash   Did it involve swelling of the face/tongue/throat, SOB, or low BP? No Did it involve sudden or severe rash/hives, skin peeling, or any reaction on the inside of your mouth or nose? Yes Did you need to seek medical attention at a hospital or doctor's office? No When did it last happen?       If all above answers are "NO", may proceed with cephalosporin use.        Medication List     STOP taking these medications    benztropine 0.5 MG tablet Commonly known as: COGENTIN   cephALEXin 500 MG capsule Commonly known as: KEFLEX   DULoxetine 20 MG capsule Commonly known as: CYMBALTA   guaiFENesin 100 MG/5ML Soln Commonly known as: ROBITUSSIN   insulin aspart 100 UNIT/ML injection Commonly known as: novoLOG   loperamide 2 MG capsule Commonly known as: IMODIUM   loratadine 10 MG tablet Commonly known as: CLARITIN   LORazepam 0.5 MG tablet Commonly known as: ATIVAN   LORazepam 1 MG tablet Commonly known as: ATIVAN   ondansetron 4 MG disintegrating tablet Commonly known as: Zofran ODT   potassium chloride 10 MEQ tablet Commonly known as: KLOR-CON       TAKE these medications    cetirizine 10 MG  tablet Commonly known as: ZYRTEC Take 10 mg by mouth daily.   clozapine 50 MG tablet Commonly known as: CLOZARIL Take 50 mg by mouth daily. Take with  clozapine   clozapine 200 MG tablet Commonly known as: CLOZARIL Take 200 mg by mouth daily. Take with  clozapine   famotidine 20 MG tablet Commonly known as: PEPCID Take 1 tablet (20 mg total) by mouth 2 (two) times daily.   insulin detemir 100 UNIT/ML injection Commonly known as: LEVEMIR Inject 0.1 mLs (10 Units total) into the skin at bedtime. What changed: how much to take   lamoTRIgine 200 MG  tablet Commonly known as: LAMICTAL Take 1 tablet (200 mg total) by mouth at bedtime.   metFORMIN 1000 MG tablet Commonly known as: GLUCOPHAGE Take 1 tablet by mouth 2 (two) times daily. What changed: Another medication with the same name was removed. Continue taking this medication, and follow the directions you see here.   ondansetron 4 MG tablet Commonly known as: Zofran Take 1 tablet (4 mg total) by mouth every 8 (eight) hours as needed for up to 2 days for nausea or vomiting.   risperiDONE 2 MG tablet Commonly known as: RISPERDAL Take 1 tablet (2 mg total) by mouth at bedtime. What changed: Another medication with the same name was removed. Continue taking this medication, and follow the directions you see here.   sertraline 100 MG tablet Commonly known as: ZOLOFT Take 150 mg by mouth in the morning. Take 1.5 tablet every morning   sulfamethoxazole-trimethoprim 800-160 MG tablet Commonly known as: BACTRIM DS Take 1 tablet by mouth 2 (two) times daily for 4 days.   Tradjenta 5 MG Tabs tablet Generic drug: linagliptin Take 5 mg by mouth daily.   traZODone 150 MG tablet Commonly known as: DESYREL Take 150 mg by mouth at bedtime. What changed: Another medication with the same name was removed. Continue taking this medication, and follow the directions you see here.   varenicline 1 MG tablet Commonly known as: CHANTIX Take 1 mg by mouth 2 (two) times daily.        Discharge Exam: Filed Weights   05/03/22 2135  Weight: 98 kg   GEN: NAD SKIN: No rash EYES: EOMI ENT: MMM CV: RRR PULM: CTA B ABD: soft, ND, NT, +BS CNS: AAO x 3, non focal EXT: No edema or tenderness GU: No CVA tenderness   Condition at discharge: good  The results of significant diagnostics from this hospitalization (including imaging, microbiology, ancillary and laboratory) are listed below for reference.   Imaging Studies: CT ABDOMEN PELVIS W CONTRAST  Result Date: 05/04/2022 CLINICAL  DATA:  Abdominal pain and fever. EXAM: CT ABDOMEN AND PELVIS WITH CONTRAST TECHNIQUE: Multidetector CT imaging of the abdomen and pelvis was performed using the standard protocol following bolus administration of intravenous contrast. RADIATION DOSE REDUCTION: This exam was performed according to the departmental dose-optimization program which includes automated exposure control, adjustment of the mA and/or kV according to patient size and/or use of iterative reconstruction technique. CONTRAST:  OMNIPAQUE IOHEXOL 300 MG/ML  SOLN COMPARISON:  Right upper quadrant ultrasound dated 06/15/2020. FINDINGS: Lower chest: Trace right pleural effusion. The visualized lung bases are otherwise clear. No intra-abdominal free air or free fluid. Hepatobiliary: Diffuse fatty liver. No intrahepatic biliary dilatation. The gallbladder is predominantly contracted. No calcified gallstone. Pancreas: Unremarkable. No pancreatic ductal dilatation or surrounding inflammatory changes. Spleen: Normal in size without focal abnormality. Adrenals/Urinary Tract: Adrenal glands are unremarkable. There is heterogeneous enhancement  of the right renal parenchyma with right perinephric stranding consistent with pyelonephritis. No abscess. The left kidney is unremarkable. There is mild fullness of the right ureter with right periureteric stranding. The left ureter and urinary bladder appear unremarkable Stomach/Bowel: There is no bowel obstruction or active inflammation. No evidence of acute appendicitis. Vascular/Lymphatic: The abdominal aorta and IVC are unremarkable. No portal venous gas. There is no adenopathy. Reproductive: The uterus is anteverted and grossly unremarkable. No adnexal masses. Other: Small fat containing umbilical hernia. Musculoskeletal: No acute or significant osseous findings. IMPRESSION: 1. Right pyelonephritis. No abscess. 2. No bowel obstruction. 3. Fatty liver. 4. Trace right pleural effusion. Electronically Signed    By: Elgie Collard M.D.   On: 05/04/2022 00:50   DG Chest Portable 1 View  Result Date: 05/03/2022 CLINICAL DATA:  Fever. EXAM: PORTABLE CHEST 1 VIEW COMPARISON:  Chest radiograph dated 04/18/2021. FINDINGS: Shallow inspiration. No focal consolidation, pleural effusion, or pneumothorax. Top-normal cardiac silhouette. No acute osseous pathology. IMPRESSION: No active cardiopulmonary disease. Electronically Signed   By: Elgie Collard M.D.   On: 05/03/2022 23:32    Microbiology: Results for orders placed or performed during the hospital encounter of 05/03/22  Blood culture (routine x 2)     Status: None (Preliminary result)   Collection Time: 05/03/22 11:01 PM   Specimen: BLOOD  Result Value Ref Range Status   Specimen Description BLOOD LEFT FA  Final   Special Requests   Final    BOTTLES DRAWN AEROBIC AND ANAEROBIC Blood Culture adequate volume   Culture   Final    NO GROWTH 2 DAYS Performed at [facility], [address] [place], [place] [address]    Report Status PENDING  Incomplete  Blood culture (routine x 2)     Status: None (Preliminary result)   Collection Time: 05/03/22 11:20 PM   Specimen: BLOOD  Result Value Ref Range Status   Specimen Description BLOOD RIGHT HAND  Final   Special Requests   Final    BOTTLES DRAWN AEROBIC AND ANAEROBIC Blood Culture adequate volume   Culture   Final    NO GROWTH 2 DAYS Performed at [facility], [address] [place], [place] [address]    Report Status PENDING  Incomplete  SARS Coronavirus 2 by RT PCR (hospital order, performed in [facility] hospital lab) *cepheid single result test* Anterior Nasal Swab     Status: None   Collection Time: 05/03/22 11:32 PM   Specimen: Anterior Nasal Swab  Result Value Ref Range Status   SARS Coronavirus 2 by RT PCR NEGATIVE NEGATIVE Final    Comment: (NOTE) SARS-CoV-2 target nucleic acids are NOT DETECTED.  The SARS-CoV-2 RNA is generally detectable in upper and  lower respiratory specimens during the acute phase of infection. The lowest concentration of SARS-CoV-2 viral copies this assay can detect is 250 copies / mL. A negative result does not preclude SARS-CoV-2 infection and should not be used as the sole basis for treatment or other patient management decisions.  A negative result may occur with improper specimen collection / handling, submission of specimen other than nasopharyngeal swab, presence of viral mutation(s) within the areas targeted by this assay, and inadequate number of viral copies (<250 copies / mL). A negative result must be combined with clinical observations, patient history, and epidemiological information.  Fact Sheet for Patients:   RoadLapTop.co.za  Fact Sheet for Healthcare Providers: http://kim-miller.com/  This test is not yet approved or  cleared by the [place] FDA and has been authorized for detection and/or diagnosis of SARS-CoV-2 by FDA under an Emergency Use Authorization (EUA).  This EUA will remain in effect (meaning this test can be used) for the duration of the COVID-19 declaration under Section 564(b)(1) of the Act, 21 U.S.C. section 360bbb-3(b)(1), unless the authorization is terminated or revoked sooner.  Performed at [facility], [address] [place], [place] [address]   Urine Culture     Status: Abnormal   Collection Time: 05/04/22  4:18 PM   Specimen: Urine, Clean Catch  Result Value Ref Range Status   Specimen Description   Final    URINE, CLEAN CATCH Performed at [facility], [address] [place], [place] [address]    Special Requests   Final    NONE Performed at [facility], [address] [place], [place] [address]    Culture MULTIPLE SPECIES PRESENT, SUGGEST RECOLLECTION (A)  Final   Report Status 05/06/2022 FINAL  Final    Labs: CBC: Recent Labs  Lab 05/03/22 2136 05/04/22 1528  WBC  16.1* 11.4*  NEUTROABS  --  8.5*  HGB 11.6* 10.9*  HCT 35.5* 33.8*  MCV 82.6 83.0  PLT 241 238   Basic Metabolic Panel: Recent Labs  Lab 05/03/22 2136 05/04/22 0649 05/05/22 0425 05/05/22 1428 05/06/22 0425  NA 131*  --  137  --   --   K 3.5  --  3.3*  --  4.0  CL 98  --  106  --   --   CO2 20*  --  25  --   --   GLUCOSE 360*  --  155*  --   --   BUN 15  --  8  --   --   CREATININE 0.79 0.77 0.70  --   --   CALCIUM 8.9  --  8.5*  --   --   MG  --   --   --  1.9  --    Liver Function Tests: Recent Labs  Lab 05/03/22 2136  AST 31  ALT 23  ALKPHOS 51  BILITOT 0.5  PROT 9.4*  ALBUMIN 3.8   CBG: Recent Labs  Lab 05/05/22 0742 05/05/22 1219 05/05/22 1548 05/05/22 2054 05/06/22 0823  GLUCAP 142* 176* 163* 241* 179*    Discharge time spent: greater than 30 minutes.  Signed: Lurene ShadowBERNARD Shandrell Boda, MD [facility] 05/06/2022

## 2022-05-08 LAB — CULTURE, BLOOD (ROUTINE X 2)
Culture: NO GROWTH
Culture: NO GROWTH
Special Requests: ADEQUATE
Special Requests: ADEQUATE

## 2022-05-08 NOTE — Progress Notes (Signed)
I got a message from Mariah Milling, RN, asking me to call [facility] for Insulin order. I called Robin on [phone] at 5:55 pm but there was no response.
